# Patient Record
Sex: Female | Born: 1980
Health system: Southern US, Community
[De-identification: ages and names within clinical notes are randomized; demographics above are authoritative.]

## PROBLEM LIST (undated history)

## (undated) DIAGNOSIS — F419 Anxiety disorder, unspecified: Secondary | ICD-10-CM

## (undated) DIAGNOSIS — F329 Major depressive disorder, single episode, unspecified: Secondary | ICD-10-CM

## (undated) DIAGNOSIS — F32A Depression, unspecified: Secondary | ICD-10-CM

## (undated) DIAGNOSIS — M549 Dorsalgia, unspecified: Secondary | ICD-10-CM

## (undated) DIAGNOSIS — G4730 Sleep apnea, unspecified: Secondary | ICD-10-CM

## (undated) DIAGNOSIS — M1990 Unspecified osteoarthritis, unspecified site: Secondary | ICD-10-CM

## (undated) DIAGNOSIS — Z8719 Personal history of other diseases of the digestive system: Secondary | ICD-10-CM

## (undated) HISTORY — DX: Personal history of other diseases of the digestive system: Z87.19

## (undated) HISTORY — PX: APPENDECTOMY: SHX54

## (undated) HISTORY — DX: Unspecified osteoarthritis, unspecified site: M19.90

## (undated) HISTORY — DX: Anxiety disorder, unspecified: F41.9

## (undated) HISTORY — PX: CHOLECYSTECTOMY: SHX55

## (undated) HISTORY — DX: Depression, unspecified: F32.A

## (undated) HISTORY — DX: Sleep apnea, unspecified: G47.30

## (undated) HISTORY — DX: Major depressive disorder, single episode, unspecified: F32.9

---

## 2001-07-21 ENCOUNTER — Other Ambulatory Visit: Admission: RE | Admit: 2001-07-21 | Discharge: 2001-07-21 | Payer: Self-pay | Admitting: Obstetrics and Gynecology

## 2002-09-12 ENCOUNTER — Other Ambulatory Visit: Admission: RE | Admit: 2002-09-12 | Discharge: 2002-09-12 | Payer: Self-pay | Admitting: Obstetrics and Gynecology

## 2003-08-02 ENCOUNTER — Inpatient Hospital Stay (HOSPITAL_COMMUNITY): Admission: EM | Admit: 2003-08-02 | Discharge: 2003-08-04 | Payer: Self-pay | Admitting: Emergency Medicine

## 2003-08-03 ENCOUNTER — Encounter (INDEPENDENT_AMBULATORY_CARE_PROVIDER_SITE_OTHER): Payer: Self-pay | Admitting: Specialist

## 2003-08-31 ENCOUNTER — Encounter: Admission: RE | Admit: 2003-08-31 | Discharge: 2003-08-31 | Payer: Self-pay | Admitting: Surgery

## 2003-10-05 ENCOUNTER — Other Ambulatory Visit: Admission: RE | Admit: 2003-10-05 | Discharge: 2003-10-05 | Payer: Self-pay | Admitting: Obstetrics and Gynecology

## 2004-04-24 ENCOUNTER — Ambulatory Visit (HOSPITAL_COMMUNITY): Admission: RE | Admit: 2004-04-24 | Discharge: 2004-04-24 | Payer: Self-pay | Admitting: Internal Medicine

## 2004-05-15 ENCOUNTER — Ambulatory Visit (HOSPITAL_COMMUNITY): Admission: RE | Admit: 2004-05-15 | Discharge: 2004-05-15 | Payer: Self-pay | Admitting: Internal Medicine

## 2005-07-17 ENCOUNTER — Inpatient Hospital Stay (HOSPITAL_COMMUNITY): Admission: AD | Admit: 2005-07-17 | Discharge: 2005-07-18 | Payer: Self-pay | Admitting: Family Medicine

## 2005-08-18 ENCOUNTER — Other Ambulatory Visit: Admission: RE | Admit: 2005-08-18 | Discharge: 2005-08-18 | Payer: Self-pay | Admitting: Obstetrics and Gynecology

## 2008-06-21 ENCOUNTER — Inpatient Hospital Stay (HOSPITAL_COMMUNITY): Admission: AD | Admit: 2008-06-21 | Discharge: 2008-06-23 | Payer: Self-pay | Admitting: Obstetrics & Gynecology

## 2008-07-03 ENCOUNTER — Inpatient Hospital Stay (HOSPITAL_COMMUNITY): Admission: AD | Admit: 2008-07-03 | Discharge: 2008-07-08 | Payer: Self-pay | Admitting: Obstetrics

## 2008-07-05 ENCOUNTER — Encounter (INDEPENDENT_AMBULATORY_CARE_PROVIDER_SITE_OTHER): Payer: Self-pay | Admitting: Obstetrics and Gynecology

## 2008-07-09 ENCOUNTER — Encounter: Admission: RE | Admit: 2008-07-09 | Discharge: 2008-08-02 | Payer: Self-pay | Admitting: Certified Nurse Midwife

## 2009-06-17 ENCOUNTER — Emergency Department (HOSPITAL_COMMUNITY): Admission: EM | Admit: 2009-06-17 | Discharge: 2009-06-17 | Payer: Self-pay | Admitting: Family Medicine

## 2009-07-07 ENCOUNTER — Emergency Department (HOSPITAL_COMMUNITY): Admission: EM | Admit: 2009-07-07 | Discharge: 2009-07-07 | Payer: Self-pay | Admitting: Emergency Medicine

## 2009-08-21 ENCOUNTER — Emergency Department (HOSPITAL_COMMUNITY): Admission: EM | Admit: 2009-08-21 | Discharge: 2009-08-21 | Payer: Self-pay | Admitting: Family Medicine

## 2009-12-06 ENCOUNTER — Emergency Department (HOSPITAL_COMMUNITY): Admission: EM | Admit: 2009-12-06 | Discharge: 2009-12-06 | Payer: Self-pay | Admitting: Family Medicine

## 2010-05-22 ENCOUNTER — Emergency Department (HOSPITAL_COMMUNITY): Admission: EM | Admit: 2010-05-22 | Discharge: 2010-05-22 | Payer: Self-pay | Admitting: Emergency Medicine

## 2010-05-23 ENCOUNTER — Ambulatory Visit (HOSPITAL_COMMUNITY): Admission: RE | Admit: 2010-05-23 | Discharge: 2010-05-23 | Payer: Self-pay | Admitting: Internal Medicine

## 2010-07-11 ENCOUNTER — Ambulatory Visit (HOSPITAL_COMMUNITY): Admission: RE | Admit: 2010-07-11 | Discharge: 2010-07-11 | Payer: Self-pay | Admitting: Surgery

## 2010-09-24 ENCOUNTER — Emergency Department (HOSPITAL_COMMUNITY)
Admission: EM | Admit: 2010-09-24 | Discharge: 2010-09-24 | Payer: Self-pay | Source: Home / Self Care | Admitting: Family Medicine

## 2010-12-08 LAB — POCT RAPID STREP A (OFFICE): Streptococcus, Group A Screen (Direct): NEGATIVE

## 2010-12-10 LAB — SURGICAL PCR SCREEN
MRSA, PCR: NEGATIVE
Staphylococcus aureus: POSITIVE — AB

## 2010-12-10 LAB — PREGNANCY, URINE: Preg Test, Ur: NEGATIVE

## 2010-12-11 LAB — BASIC METABOLIC PANEL
BUN: 9 mg/dL (ref 6–23)
CO2: 27 mEq/L (ref 19–32)
Calcium: 9.2 mg/dL (ref 8.4–10.5)
Chloride: 104 mEq/L (ref 96–112)
Creatinine, Ser: 0.91 mg/dL (ref 0.4–1.2)
GFR calc Af Amer: >60 mL/min (ref >60)
GFR calc non Af Amer: >60 mL/min (ref >60)
Glucose, Bld: 93 mg/dL (ref 70–99)
Potassium: 4.4 mEq/L (ref 3.5–5.1)
Sodium: 136 mEq/L (ref 135–145)

## 2010-12-19 ENCOUNTER — Other Ambulatory Visit (HOSPITAL_COMMUNITY): Payer: Self-pay | Admitting: Orthopedic Surgery

## 2010-12-19 DIAGNOSIS — IMO0002 Reserved for concepts with insufficient information to code with codable children: Secondary | ICD-10-CM

## 2010-12-23 ENCOUNTER — Ambulatory Visit (HOSPITAL_COMMUNITY)
Admission: RE | Admit: 2010-12-23 | Discharge: 2010-12-23 | Disposition: A | Discharge status: Home / Self Care | Payer: 59 | Source: Ambulatory Visit | Attending: Orthopedic Surgery | Admitting: Orthopedic Surgery

## 2010-12-23 DIAGNOSIS — M51379 Other intervertebral disc degeneration, lumbosacral region without mention of lumbar back pain or lower extremity pain: Secondary | ICD-10-CM | POA: Insufficient documentation

## 2010-12-23 DIAGNOSIS — M5137 Other intervertebral disc degeneration, lumbosacral region: Secondary | ICD-10-CM | POA: Insufficient documentation

## 2010-12-23 DIAGNOSIS — IMO0002 Reserved for concepts with insufficient information to code with codable children: Secondary | ICD-10-CM

## 2011-01-13 ENCOUNTER — Ambulatory Visit: Payer: 59 | Attending: Orthopedic Surgery | Admitting: Rehabilitation

## 2011-01-13 DIAGNOSIS — R262 Difficulty in walking, not elsewhere classified: Secondary | ICD-10-CM | POA: Insufficient documentation

## 2011-01-13 DIAGNOSIS — M25559 Pain in unspecified hip: Secondary | ICD-10-CM | POA: Insufficient documentation

## 2011-01-13 DIAGNOSIS — IMO0001 Reserved for inherently not codable concepts without codable children: Secondary | ICD-10-CM | POA: Insufficient documentation

## 2011-01-13 DIAGNOSIS — M545 Low back pain, unspecified: Secondary | ICD-10-CM | POA: Insufficient documentation

## 2011-01-13 DIAGNOSIS — R293 Abnormal posture: Secondary | ICD-10-CM | POA: Insufficient documentation

## 2011-01-26 ENCOUNTER — Ambulatory Visit: Payer: 59

## 2011-01-28 ENCOUNTER — Ambulatory Visit: Payer: 59 | Attending: Orthopedic Surgery | Admitting: Physical Therapy

## 2011-01-28 DIAGNOSIS — M545 Low back pain, unspecified: Secondary | ICD-10-CM | POA: Insufficient documentation

## 2011-01-28 DIAGNOSIS — R293 Abnormal posture: Secondary | ICD-10-CM | POA: Insufficient documentation

## 2011-01-28 DIAGNOSIS — R262 Difficulty in walking, not elsewhere classified: Secondary | ICD-10-CM | POA: Insufficient documentation

## 2011-01-28 DIAGNOSIS — M25559 Pain in unspecified hip: Secondary | ICD-10-CM | POA: Insufficient documentation

## 2011-01-28 DIAGNOSIS — IMO0001 Reserved for inherently not codable concepts without codable children: Secondary | ICD-10-CM | POA: Insufficient documentation

## 2011-02-04 ENCOUNTER — Ambulatory Visit: Payer: 59 | Admitting: Rehabilitation

## 2011-02-05 ENCOUNTER — Ambulatory Visit: Payer: 59 | Admitting: Rehabilitation

## 2011-02-10 ENCOUNTER — Ambulatory Visit: Payer: 59 | Admitting: Physical Therapy

## 2011-02-10 NOTE — H&P (Signed)
Bonnie Lee, Bonnie Lee            ACCOUNT NO.:  [id number]   MEDICAL RECORD NO.:  [id number]          PATIENT TYPE:  INP   LOCATION:  9158                          FACILITY:  [facility]   PHYSICIAN:  Lenoard Aden, M.D.DATE OF BIRTH:  1981/05/17   DATE OF ADMISSION:  06/21/2008  DATE OF DISCHARGE:                              HISTORY & PHYSICAL   CHIEF COMPLAINT:  Elevated blood pressure, oligohydramnios.   For admission, she is a 30 year old white female G1, P0 at 35-1/7 weeks  of elevated blood pressure and an AFI of 4.3 for admission.  She has no  known drug allergies.   MEDICATIONS:  1. Prenatal vitamins.  2. Unasyn p.r.n.   FAMILY HISTORY:  Hypertension; lung, brain, stomach cancer, heart  disease, alcohol abuse, and diabetes.  She is a nonsmoker and  nondrinker.  She denies domestic physical violence pregnancy course  otherwise uncomplicated.   SURGICAL HISTORY:  Remarkable for appendectomy and ovarian cystectomy.   PRENATAL COURSE:  Today is complicated by third trimester onset of  elevated blood pressure with normal 24-hour urine 1 week ago and normal  PIH labs.   PHYSICAL EXAMINATION:  GENERAL:  Well-developed, well-nourished white  female.  VITAL SIGNS:  Blood pressure 142/92, trace proteinuria.  HEENT:  Normal.  LUNGS:  Clear.  NECK:  Supple for range of motion.  HEART:  Regular rhythm.  ABDOMEN:  Soft, gravid, nontender.  EXTREMITIES:  No right upper tenderness.  No CVA tenderness.  DTRs 2+,  trace pretibial edema, and no clonus.  PELVIC:  Cervix is closed 2 cm on,  vertex -2, extremities revealed no  cords.  NEUROLOGIC:  Nonfocal.  SKIN:  Intact.   NST is reactive.   IMPRESSION:  1. A 35-1/7 week intrauterine pregnancy.  2. Gestational hypertension, rule out preeclampsia.  3. Oligohydramnios.   PLAN:  IV fluid administration, serial labs, 24-hour urine, bedrest,  bathroom privileges, continuous monitoring.  Management pending outcome  of labs and  responds to inpatient bedrest hospitalization and serial  sonography.      Lenoard Aden, M.D.  Electronically Signed     RJT/MEDQ  D:  06/21/2008  T:  06/22/2008  Job:  [id number]

## 2011-02-10 NOTE — Discharge Summary (Signed)
Bonnie Lee, Bonnie Lee            ACCOUNT NO.:  [id number]   MEDICAL RECORD NO.:  [id number]          PATIENT TYPE:  INP   LOCATION:  9158                          [facility]   PHYSICIAN:  Lendon Colonel, MD   DATE OF BIRTH:  1981/01/05   DATE OF ADMISSION:  06/21/2008  DATE OF DISCHARGE:  06/23/2008                               DISCHARGE SUMMARY   CHIEF COMPLAINT:  Oligohydramnios.   HISTORY OF PRESENT ILLNESS:  This is a 30 year old G1 admitted at 35  weeks with oligohydramnios and then AFI of 5.  The patient otherwise had  no complaints.  No signs or symptoms of preeclampsia.  No elevated blood  pressures and otherwise normal fetal testing.  The patient was admitted  for serial blood pressures.  PIH labs, serial fetal monitoring and IV  fluid hydration.  The patient did well throughout her stay and that her  exhibited, elevated blood pressures, and on hospital day #3 was without  complaints of headache, vision change, change in symptoms, or  preeclampsia.  Her blood pressure were 130 over 70s, other vitals were  stable.  Her exam was within normal limits.  Labs resulted with a 24-  hour urine results of 186 mg of protein, uric acid of 5.1, creatinine,  AST, ALT all within normal limits.  A umbilical artery Dopplers were  within normal limits at the 60 percentile.  A repeat AFI after gentle  fluid hydration was 15 and a NST showed fetal heart tracing in the 130s  with good accelerations, no decelerations.  Tocometer showed the absence  of any contractions.  Decision was made to discharge the patient home on  modified bed rest and return to the office in 4 days for a repeat  evaluation.   DISCHARGE DIAGNOSIS:  Oligohydramnios, resolved.   DISCHARGE CONDITION:  Stable.   DISCHARGE MEDICATIONS:  Prenatal vitamins.   DISCHARGE INSTRUCTIONS:  Modified bed rest at home and returned to the  office in 4 days time for a repeat evaluation.  The patient is to call  with any  headache, vision change, or decreased fetal movement.      Lendon Colonel, MD  Electronically Signed     KAF/MEDQ  D:  06/23/2008  T:  06/24/2008  Job:  [id number]

## 2011-02-10 NOTE — Op Note (Signed)
[name], Bonnie Lee            ACCOUNT NO.:  [id number]   MEDICAL RECORD NO.:  [id number]          PATIENT TYPE:  INP   LOCATION:  9128                          [facility]   PHYSICIAN:  Lenoard Aden, M.D.DATE OF BIRTH:  [birth year]   DATE OF PROCEDURE:  DATE OF DISCHARGE:                               OPERATIVE REPORT   PREOPERATIVE DIAGNOSES:  Failure to descend, term intrauterine  pregnancy, and gestational hypertension.   POSTOPERATIVE DIAGNOSES:  Failure to descend, term intrauterine  pregnancy, and gestational hypertension.   PROCEDURE:  Primary low-segment transverse Cesarean section.   SURGEON:  Lenoard Aden, MD.   ASSISTANT:  Marlinda Mike, C.N.M.   ANESTHESIA:  Epidural by Jean Rosenthal.   ESTIMATED BLOOD LOSS:  1000 mL.   COMPLICATIONS:  None.   DRAINS:  Foley.   COUNTS:  Correct.   The patient to recovery in good condition.   FINDINGS:  Full-term living female, Apgars 8 and 9, and posterior  placenta.  Normal tubes and normal ovaries.  Two-layer uterine closure.   DESCRIPTION OF PROCEDURE:  After being apprised of risks of anesthesia,  infection, bleeding, injury to abdominal organs and need for repair, the  labor's immediate complications including bowel and bladder injury, the  patient was brought to the operating where she was administered on a  dosing of epidural anesthetic without complications and prepped and  draped in usual sterile fashion.  A Foley catheter was previously  placed.  Dilute Marcaine solution was placed.  A Pfannenstiel skin  incision was made with scalpel and carried down to fascia, which was  nicked in the midline and opened transversely using Mayo scissors.  Rectus muscle was dissected sharply in the midline.  Peritoneum was  entered sharply.  Bladder blade placed.  Visceral peritoneum was scored  sharply off the lower uterine segment.  Kerr hysterotomy incision was  made.  Atraumatic delivery of a female fetus was noted,  Apgars 8 and 9,  pediatrician's attendance.  Cord blood was collected.  Placenta was  delivered manually, intact 3-vessel cord, uterus curetted using a dry  lap pack and closed in 2 running imbricating layers of 0-Monocryl  suture.  Small subserosal fibroid in the mid fundal body of the uterus  anteriorly was noted.  At this time, good hemostasis was then achieved.  Irrigation was accomplished.  Bladder flap was inspected and found to be  hemostatic.  Fascia  was then closed using 0-Monocryl in continuous running fashion.  Subcutaneous tissue was reapproximated using 0 plain in a continuous  running fashion.  Skin was closed using a subcuticular closure of 4-0  Vicryl on a Keith needle.  Steri-Strips were placed.  The patient  tolerated the procedure well and was transferred to recovery in good  condition.      Lenoard Aden, M.D.  Electronically Signed     RJT/MEDQ  D:  07/05/2008  T:  07/06/2008  Job:  [id number]

## 2011-02-12 ENCOUNTER — Ambulatory Visit: Payer: 59 | Admitting: Physical Therapy

## 2011-02-13 NOTE — H&P (Signed)
NAMESUHAILA, TROIANO                            ACCOUNT NO.:  [id number]   MEDICAL RECORD NO.:  [id number]                   PATIENT TYPE:  EMS   LOCATION:  ED                                   FACILITY:  [facility]   PHYSICIAN:  Currie Paris, M.D.           DATE OF BIRTH:  1980-10-09   DATE OF ADMISSION:  08/02/2003  DATE OF DISCHARGE:                                HISTORY & PHYSICAL   CHIEF COMPLAINT:  Abdominal pain.   HISTORY OF PRESENT ILLNESS:  Ms. Madilyn Fireman was seen by Dr. Marisue Brooklyn, and we  were asked to see her today because of possible appendicitis.  She has about  a 30 hour history now of abdominal pain which has migrated into the right  lower quadrant and associated with some nausea and vomiting.  She notes the  pain waxes and wanes and was worse earlier, a little bit better now.  She  feels better when she is still.  She has not been hungry.  She has not had  diarrhea.  She did have a CT earlier today.  She has never had any symptoms  prior to this.  She has normal periods, but occasionally has two periods in  a month.   MEDICATIONS:  Lexapro.   ALLERGIES:  No known drug allergies.   HABITS:  Smokes:  None.  Alcohol:  None.   FAMILY HISTORY:  Basically unremarkable.  She does have some heart disease,  but no significant problems.   REVIEW OF SYSTEMS:  HEENT:  Negative.  CHEST:  No cough or shortness of  breath.  HEART:  No history of murmurs or hypertension.  ABDOMEN:  Negative  except for HPI.  GENITOURINARY:  Negative.  EXTREMITIES:  Negative.   PHYSICAL EXAMINATION:  GENERAL:  The patient is alert, oriented, generally  healthy-appearing, but somewhat comfortable.  VITAL SIGNS:  Blood pressure 119/80, pulse 82, temperature 98.  HEENT:  Head is normocephalic.  Eyes:  Pupils equal, round, and regular.  EOMs intact.  Nonicteric.  NECK:  Supple with no masses or thyromegaly.  LUNGS:  Normal respirations, clear to auscultation.  HEART:  No murmurs, rubs, or  gallops are heard.  Regular rhythm.  She has  regular carotid, femoral, and dorsalis pedis pulses.  ABDOMEN:  Soft on the left side, but markedly tender with guarding on the  right side, particularly in the right lower quadrant with some mild rebound.  Bowel sounds are positive, but diminished.  PELVIC:  Not done, they were done by Dr. Pennie Rushing.  RECTAL:  Not done, they were done by Dr. Pennie Rushing.  EXTREMITIES:  No cyanosis or edema.  Good range of motion.   LABORATORY DATA:  CT is reviewed and she has what appears to be an enlarged  appendix consistent with appendicitis.  Question some gallstones in her  gallbladder, but no evidence of acute inflammatory changes around that, and  what  appears to be a dermoid cyst in the right ovary.   IMPRESSION:  1. Acute appendicitis.  2. Dermoid cyst.  3. Possible gallstones.   PLAN:  We will proceed to laparoscopy for hopefully a laparoscopic  appendectomy.  Dr. Pennie Rushing has seen her and will be present and hopefully be  able to get her dermoid cyst out at the same time.  Again, laparoscopically,  but we talked to the patient that either of these may need to be done open.  Risks and complications were discussed.  The patient would like to proceed  with surgery.  We will start her on some intravenous fluids and antibiotics.                                               Currie Paris, M.D.    CJS/MEDQ  D:  08/02/2003  T:  08/02/2003  Job:  [id number]

## 2011-02-13 NOTE — Op Note (Signed)
Bonnie Lee, Bonnie Lee                            ACCOUNT NO.:  [id number]   MEDICAL RECORD NO.:  [id number]                   PATIENT TYPE:  INP   LOCATION:  0105                                 FACILITY:  [facility]   PHYSICIAN:  Currie Paris, M.D.           DATE OF BIRTH:  06/15/1981   DATE OF PROCEDURE:  08/02/2003  DATE OF DISCHARGE:                                 OPERATIVE REPORT   PREOPERATIVE DIAGNOSES:  1. Acute appendicitis.  2. Right ovarian dermoid cyst.   POSTOPERATIVE DIAGNOSES:  1. Acute appendicitis.  2. Right ovarian dermoid cyst.   OPERATION:  Laparoscopic appendectomy.   SURGEON:  Currie Paris, M.D.   ANESTHESIA:  General.   CLINICAL HISTORY:  This patient is a 30 year old who presented with a two  day history of right lower quadrant pain, nausea, anorexia and a CT that  showed appendicitis but incidentally found a right ovarian dermoid cyst.  After discussion with the patient and evaluation by Dr. Pennie Rushing, we elected  to proceed to laparoscopy with plan for laparoscopic appendectomy and if  possible ovarian cystectomy.   DESCRIPTION OF PROCEDURE:  The patient was seen in the holding area and had  no further questions. She was taken to the operating room and after  satisfactory general endotracheal anesthesia had been obtained, the abdomen  and perineum were prepped and draped. The umbilical area was infiltrated  with some Xylocaine, a short incision made and the fascia opened, the  pursestring placed and the Hasson introduced. The abdomen was insufflated to  15. At this point, Dr. Pennie Rushing placed a 5 mm trocar into the right lower  quadrant and a 10/11 in the left lower under direct vision and performed an  ovarian cystectomy which is dictated separately. Once that was done, I  placed a 5 mm trocar in the right upper quadrant and the patient was placed  and was already in some reverse Trendelenburg. The appendix was identified,  thickened with  some edema of the mesentery. Once it was grasped, I divided  the mesoappendix with the harmonic scalpel down to the base of the appendix.  Once that was done, the endoGIA was placed and the appendix divided. The  base appeared intact and dry and the appendix was brought out the umbilical  port. We spent several minutes with further irrigation to make sure we had  all the fluid out from the ovarian cystectomy. Once everything appeared  okay, the trocars were removed sequentially with the abdomen being  deflated through the umbilical port. This pursestring here was tied down.  The skin was closed with 4-0 Monocryl subcuticular and Dermabond. The  patient tolerated the procedure well. There were no operative complications  and all counts were correct.  Currie Paris, M.D.    CJS/MEDQ  D:  08/02/2003  T:  08/02/2003  Job:  [id number]   cc:   Lovenia Kim, D.O.  [address]  [place]  [place] [address]  Fax: [phone]   Hal Morales, M.D.  [address], [address]  [place]  [place] [address]  Fax: [phone]

## 2011-02-13 NOTE — Discharge Summary (Signed)
NAMEAMERIA, Bonnie Lee            ACCOUNT NO.:  [id number]   MEDICAL RECORD NO.:  [id number]          PATIENT TYPE:  INP   LOCATION:  9128                          [facility]   PHYSICIAN:  Lenoard Aden, M.D.DATE OF BIRTH:  [birth year]   DATE OF ADMISSION:  07/03/2008  DATE OF DISCHARGE:  07/08/2008                               DISCHARGE SUMMARY   After a prolonged cervical ripening, the patient underwent uncomplicated  primary C-section.  Her active phase arrest on July 05, 2008.  Her  postoperative course was complicated by well-controlled blood pressure.  She was discharged to home on postoperative day #3.   Discharge teaching done.   DISCHARGE MEDICATIONS:  1. Tylox.  2. Prenatal vitamins.  3. Iron.   FOLLOWUP:  Follow up in the office as needed.      Lenoard Aden, M.D.  Electronically Signed     RJT/MEDQ  D:  08/21/2008  T:  08/21/2008  Job:  [id number]

## 2011-02-13 NOTE — Op Note (Signed)
Bonnie Lee, MILBRATH                            ACCOUNT NO.:  [id number]   MEDICAL RECORD NO.:  [id number]                   PATIENT TYPE:  INP   LOCATION:  0440                                 FACILITY:  [facility]   PHYSICIAN:  Hal Morales, M.D.             DATE OF BIRTH:  Jun 05, 1981   DATE OF PROCEDURE:  08/02/2003  DATE OF DISCHARGE:                                 OPERATIVE REPORT   PREOPERATIVE DIAGNOSES:  Appendicitis and right ovarian dermoid cyst.   POSTOPERATIVE DIAGNOSES:  Appendicitis and right ovarian dermoid cyst.   OPERATION:  Laparoscopic right ovarian dermoid cystectomy, laparoscopic  appendectomy.   SURGEON:  1. Hal Morales, M.D. for the ovarian cystectomy.  2. Currie Paris, M.D. for the appendectomy.   ANESTHESIA:  General oral tracheal.   ESTIMATED BLOOD LOSS:  Less than 50 mL.   COMPLICATIONS:  None.   FINDINGS:  Upon entry into the peritoneal cavity, the uterus was noted to be  normal size. The fallopian tubes bilaterally were normal with normal fine  fimbriated end. The left ovary was normal size. The right ovary was enlarged  by a 3 cm dermoid cyst. There were no adhesions or stigmata of  endometriosis.   DESCRIPTION OF PROCEDURE:  The patient was taken to the operating room after  appropriate identification and placed on the operating table. After the  attainment of adequate general anesthesia, the patient was placed in the  modified lithotomy position. The abdomen, perineum and vagina were prepped  with multiple layers of Betadine. A Foley catheter was inserted into the  bladder under sterile conditions and a sponge stick containing three raytec  sponges was placed in the vagina for uterine manipulation. The abdomen was  then draped as a sterile field. A subumbilical incision area was infiltrated  with 0.25% Marcaine as were two areas in the suprapubic region. The  subumbilical incision was made by Dr. Jamey Ripa and taken down to the  fascia.  The fascia was incised and the peritoneum entered. The Hasson cannula was  placed in the peritoneal cavity after placement of a pursestring suture and  the pneumoperitoneum created. The laparoscope was then placed through the  trocar sleeve and the above noted findings made. Suprapubic incisions were  made to the right and left of midline and laparoscopic probe trocars placed  through those incisions into the peritoneal cavity under direct  visualization. The right ovary was elevated by the uteroovarian ligament and  the ovarian cortex inside with harmonic scalpel scissors. An attempt was  made to hydrodissect the ovarian cortex off the cyst wall; however, during  that attempt, the cyst wall was ruptured and the remainder of the ovarian  cyst wall peeled off the overlying cortex with a combination of sharp and  blunt dissection and hydrodissection. The ovarian cyst wall was removed in  pieces through the lower port in the left suprapubic region.  The remainder  of the cyst was likewise removed through that port. Copious irrigation of  approximately 4 liters of warm lactated Ringer's was undertaken and  hemostasis in the ovarian cortex on the right side noted to be  adequate. At this time, the procedure was started by Dr. Jamey Ripa to achieve  the appendectomy. The remainder of that procedure will be dictated under a  separate operative report. Specimen to pathology from this portion of the  surgical procedure is right ovarian cyst wall and dermoid.                                               Hal Morales, M.D.    VPH/MEDQ  D:  08/03/2003  T:  08/03/2003  Job:  [id number]

## 2011-02-18 ENCOUNTER — Ambulatory Visit: Payer: 59 | Admitting: Rehabilitation

## 2011-02-19 ENCOUNTER — Ambulatory Visit: Payer: 59 | Admitting: Rehabilitation

## 2011-02-24 ENCOUNTER — Ambulatory Visit: Payer: 59 | Admitting: Rehabilitation

## 2011-02-26 ENCOUNTER — Ambulatory Visit: Payer: 59 | Admitting: Rehabilitation

## 2011-03-04 ENCOUNTER — Ambulatory Visit: Payer: 59 | Attending: Rehabilitation | Admitting: Rehabilitation

## 2011-03-04 DIAGNOSIS — R262 Difficulty in walking, not elsewhere classified: Secondary | ICD-10-CM | POA: Insufficient documentation

## 2011-03-04 DIAGNOSIS — M545 Low back pain, unspecified: Secondary | ICD-10-CM | POA: Insufficient documentation

## 2011-03-04 DIAGNOSIS — R293 Abnormal posture: Secondary | ICD-10-CM | POA: Insufficient documentation

## 2011-03-04 DIAGNOSIS — IMO0001 Reserved for inherently not codable concepts without codable children: Secondary | ICD-10-CM | POA: Insufficient documentation

## 2011-03-04 DIAGNOSIS — M25559 Pain in unspecified hip: Secondary | ICD-10-CM | POA: Insufficient documentation

## 2011-03-05 ENCOUNTER — Encounter: Payer: 59 | Admitting: Rehabilitation

## 2011-03-10 ENCOUNTER — Ambulatory Visit: Payer: 59 | Admitting: Physical Therapy

## 2011-03-12 ENCOUNTER — Ambulatory Visit: Payer: 59 | Admitting: Physical Therapy

## 2011-03-17 ENCOUNTER — Ambulatory Visit: Payer: 59 | Admitting: Physical Therapy

## 2011-03-24 ENCOUNTER — Encounter: Payer: 59 | Admitting: Physical Therapy

## 2011-05-19 ENCOUNTER — Inpatient Hospital Stay (INDEPENDENT_AMBULATORY_CARE_PROVIDER_SITE_OTHER)
Admission: RE | Admit: 2011-05-19 | Discharge: 2011-05-19 | Disposition: A | Discharge status: Home / Self Care | Payer: 59 | Source: Ambulatory Visit | Attending: Emergency Medicine | Admitting: Emergency Medicine

## 2011-05-19 DIAGNOSIS — J040 Acute laryngitis: Secondary | ICD-10-CM

## 2011-05-19 LAB — POCT RAPID STREP A: Streptococcus, Group A Screen (Direct): NEGATIVE

## 2011-06-29 LAB — COMPREHENSIVE METABOLIC PANEL
ALT: 19
ALT: 21
AST: 25
AST: 24
Albumin: 2.7 — ABNORMAL LOW
Albumin: 2.6 — ABNORMAL LOW
Albumin: 2.7 — ABNORMAL LOW
Alkaline Phosphatase: 93
Alkaline Phosphatase: 102
Alkaline Phosphatase: 99
BUN: 9
BUN: 5 — ABNORMAL LOW
BUN: 7
CO2: 23
CO2: 23
CO2: 23
Calcium: 10.3
Calcium: 9.7
Chloride: 106
Chloride: 104
Chloride: 106
Creatinine, Ser: 0.61
Creatinine, Ser: 0.74
Creatinine, Ser: 0.66
GFR calc Af Amer: >60
GFR calc Af Amer: >60
GFR calc Af Amer: >60
GFR calc non Af Amer: >60
GFR calc non Af Amer: >60
GFR calc non Af Amer: >60
Glucose, Bld: 89
Glucose, Bld: 97
Glucose, Bld: 104 — ABNORMAL HIGH
Potassium: 3.6
Potassium: 3.4 — ABNORMAL LOW
Potassium: 3.8
Sodium: 137
Sodium: 139
Total Bilirubin: 0.4
Total Bilirubin: 0.2 — ABNORMAL LOW
Total Protein: 5.3 — ABNORMAL LOW
Total Protein: 6.1
Total Protein: 6.2

## 2011-06-29 LAB — CBC
HCT: 33.7 — ABNORMAL LOW
HCT: 28.4 — ABNORMAL LOW
HCT: 34.8 — ABNORMAL LOW
HCT: 34.9 — ABNORMAL LOW
Hemoglobin: 11.4 — ABNORMAL LOW
Hemoglobin: 11.8 — ABNORMAL LOW
Hemoglobin: 12.2
Hemoglobin: 9.9 — ABNORMAL LOW
MCHC: 33.9
MCHC: 34.1
MCHC: 33.9
MCV: 91.3
MCV: 91.6
MCV: 92.3
Platelets: 234
Platelets: 237
Platelets: 241
RBC: 3.67 — ABNORMAL LOW
RBC: 3.08 — ABNORMAL LOW
RBC: 3.78 — ABNORMAL LOW
RBC: 3.91
RDW: 13.3
RDW: 13.6
RDW: 13.7
WBC: 13.4 — ABNORMAL HIGH
WBC: 13.1 — ABNORMAL HIGH
WBC: 14.6 — ABNORMAL HIGH
WBC: 16.2 — ABNORMAL HIGH

## 2011-06-29 LAB — LACTATE DEHYDROGENASE
LDH: 110
LDH: 90 — ABNORMAL LOW
LDH: 106

## 2011-06-29 LAB — CREATININE CLEARANCE, URINE, 24 HOUR
Creatinine Clearance: 138 — ABNORMAL HIGH
Creatinine, 24H Ur: 1469
Creatinine: 0.74

## 2011-06-29 LAB — URIC ACID
Uric Acid, Serum: 5.5
Uric Acid, Serum: 5.3
Uric Acid, Serum: 5.8

## 2011-06-29 LAB — RH IMMUNE GLOB WKUP(>/=20WKS)(NOT WOMEN'S HOSP): Fetal Screen: NEGATIVE

## 2011-06-29 LAB — PROTEIN, URINE, 24 HOUR
Collection Interval-UPROT: 24
Protein, Urine: 6

## 2011-06-29 LAB — RPR: RPR Ser Ql: NONREACTIVE

## 2011-06-29 LAB — PLATELET COUNT: Platelets: 212

## 2011-08-03 ENCOUNTER — Encounter: Payer: Self-pay | Admitting: *Deleted

## 2011-08-03 ENCOUNTER — Emergency Department (HOSPITAL_COMMUNITY)
Admission: EM | Admit: 2011-08-03 | Discharge: 2011-08-03 | Disposition: A | Discharge status: Home / Self Care | Payer: 59 | Source: Home / Self Care

## 2011-08-03 DIAGNOSIS — K120 Recurrent oral aphthae: Secondary | ICD-10-CM

## 2011-08-03 MED ORDER — MAGIC MOUTHWASH W/LIDOCAINE: ORAL | Status: DC

## 2011-08-03 NOTE — ED Provider Notes (Signed)
History     CSN: [id number] Arrival date & time: 08/03/2011  6:29 PM   None     Chief Complaint  Patient presents with  . Sore Throat    (Consider location/radiation/quality/duration/timing/severity/associated sxs/prior treatment) HPI Comments: Pt states that her throat is sore but she also has sores on the roof of her mouth and inside of her cheeks. Has mild nasal congestion. No post nasal drainage, cough or fever. Hx of strep throat in the past.   Patient is a 30 y.o. female presenting with pharyngitis. The history is provided by the patient.  Sore Throat This is a new problem. The current episode started 2 days ago. The problem occurs constantly. The problem has not changed since onset.Pertinent negatives include no chest pain, no headaches and no shortness of breath. The symptoms are aggravated by swallowing, eating and drinking. The symptoms are relieved by nothing.    Past Medical History  Diagnosis Date  . Migraine     Past Surgical History  Procedure Date  . Abdominal surgery   . Cholecystectomy     Family History  Problem Relation Age of Onset  . Hypertension Mother     History  Substance Use Topics  . Smoking status: Never Smoker   . Smokeless tobacco: Not on file  . Alcohol Use: No    OB History    Grav Para Term Preterm Abortions TAB SAB Ect Mult Living                  Review of Systems  Constitutional: Negative for fever, chills and fatigue.  HENT: Positive for congestion, sore throat and mouth sores. Negative for ear pain, rhinorrhea, sneezing, postnasal drip and sinus pressure.   Respiratory: Negative for cough, shortness of breath and wheezing.   Cardiovascular: Negative for chest pain and palpitations.  Neurological: Negative for headaches.    Allergies  Oxycodone  Home Medications   Current Outpatient Rx  Name Route Sig Dispense Refill  . CELEXA PO Oral Take by mouth.      . MELOXICAM PO Oral Take by mouth.      Marland Kitchen PROPRANOLOL HCL  INTENSOL PO Oral Take by mouth.      . IMITREX PO Oral Take by mouth.      . TRAMADOL HCL 50 MG PO TABS Oral Take 50 mg by mouth every 6 (six) hours as needed. Maximum dose= 8 tablets per day     . MAGIC MOUTHWASH W/LIDOCAINE  Swish and gargle 5 ml every 4 hrs prn mouth and throat pain 120 mL 0    BP 128/85  Pulse 67  Temp(Src) 98.3 F (36.8 C) (Oral)  Resp 16  SpO2 100%  LMP 08/02/2011  Physical Exam  Constitutional: She appears well-developed and well-nourished. No distress.  HENT:  Head: Normocephalic and atraumatic.  Right Ear: Tympanic membrane, external ear and ear canal normal.  Left Ear: Tympanic membrane, external ear and ear canal normal.  Nose: Nose normal.  Mouth/Throat: Uvula is midline and oropharynx is clear and moist. Oral lesions (scattered shallow ulcers noted buccal mucosa) present. No oropharyngeal exudate, posterior oropharyngeal edema or posterior oropharyngeal erythema.  Neck: Neck supple.  Cardiovascular: Normal rate, regular rhythm and normal heart sounds.   Pulmonary/Chest: Effort normal and breath sounds normal. No respiratory distress.  Lymphadenopathy:    She has no cervical adenopathy.  Neurological: She is alert.  Skin: Skin is warm and dry.  Psychiatric: She has a normal mood and affect.  ED Course  Procedures (including critical care time)  Labs Reviewed - No data to display No results found.   1. Aphthous stomatitis       MDM          Melody Comas, [place] 08/03/11 1927

## 2011-08-03 NOTE — ED Notes (Signed)
Pt  Reports  Symptoms  Of a  sorethroat    With  What  She  Describes s  As  Small  Sores in her  Mouth    -     She  Reports  Pain when she  Swallows     She  Is  Awake  As  Well  As  Alert and  Oriented       Appears  In no   Acute  Distress  Talking in complete  sentances

## 2012-02-23 ENCOUNTER — Other Ambulatory Visit (HOSPITAL_COMMUNITY): Payer: Self-pay | Admitting: Physician Assistant

## 2012-02-23 DIAGNOSIS — R7989 Other specified abnormal findings of blood chemistry: Secondary | ICD-10-CM

## 2012-02-24 ENCOUNTER — Ambulatory Visit (HOSPITAL_COMMUNITY)
Admission: RE | Admit: 2012-02-24 | Discharge: 2012-02-24 | Disposition: A | Discharge status: Home / Self Care | Payer: 59 | Source: Ambulatory Visit | Attending: Physician Assistant | Admitting: Physician Assistant

## 2012-02-24 DIAGNOSIS — R7989 Other specified abnormal findings of blood chemistry: Secondary | ICD-10-CM | POA: Insufficient documentation

## 2012-04-18 ENCOUNTER — Encounter: Payer: Self-pay | Admitting: Gastroenterology

## 2012-05-16 ENCOUNTER — Ambulatory Visit: Payer: 59 | Admitting: Gastroenterology

## 2012-05-27 ENCOUNTER — Ambulatory Visit (INDEPENDENT_AMBULATORY_CARE_PROVIDER_SITE_OTHER): Payer: 59 | Admitting: Gastroenterology

## 2012-05-27 ENCOUNTER — Other Ambulatory Visit (INDEPENDENT_AMBULATORY_CARE_PROVIDER_SITE_OTHER): Payer: 59

## 2012-05-27 ENCOUNTER — Encounter: Payer: Self-pay | Admitting: Gastroenterology

## 2012-05-27 VITALS — BP 106/70 | HR 84 | Ht 64.25 in | Wt 241.0 lb

## 2012-05-27 DIAGNOSIS — K921 Melena: Secondary | ICD-10-CM

## 2012-05-27 LAB — CBC WITH DIFFERENTIAL/PLATELET
Basophils Relative: 1.4 % (ref 0.0–3.0)
Eosinophils Absolute: 0.2 10*3/uL (ref 0.0–0.7)
Eosinophils Relative: 2.3 % (ref 0.0–5.0)
HCT: 37 % (ref 36.0–46.0)
Lymphs Abs: 2.6 10*3/uL (ref 0.7–4.0)
MCHC: 33.6 g/dL (ref 30.0–36.0)
MCV: 89.3 fl (ref 78.0–100.0)
Monocytes Absolute: 0.5 10*3/uL (ref 0.1–1.0)
Neutrophils Relative %: 60.2 % (ref 43.0–77.0)
Platelets: 298 10*3/uL (ref 150.0–400.0)
RBC: 4.14 Mil/uL (ref 3.87–5.11)
WBC: 8.8 10*3/uL (ref 4.5–10.5)

## 2012-05-27 MED ORDER — MOVIPREP 100 G PO SOLR: 1.0000 | ORAL | Status: DC

## 2012-05-27 NOTE — Progress Notes (Signed)
HPI: This is a   very pleasant 31 year old woman whom I am meeting for the first time today   Therapy at behavioral health for [facility] been having rectal bleeding for a while, since this past spring. Went to PCP, who felt it may have been a hemorhoid.  Cream applied, this helped some but not completely.  Usually after a difficult BM.  She will often have to push strain, can alternate with loose stools.  She has tried colace, then imodium. Never tried fiber suplement.  Not bloody diarrhea.  Has gained 50 pounds in past year or so.  She is a stress eater.  No signficant pains in abd except with signficantly difficult BMs.  She will see blood approximately one to 2 times a week  GB removed 2 years ago.  Past 4-5 months, brisk gastrocolic reflex.  Review of systems: Pertinent positive and negative review of systems were noted in the above HPI section. Complete review of systems was performed and was otherwise normal.    Past Medical History  Diagnosis Date  . Migraine   . Anxiety   . Arthritis   . Depression   . History of gallstones   . Sleep apnea     Past Surgical History  Procedure Date  . Cholecystectomy   . Appendectomy   . Cesarean section     and ovarian cyst removed    Current Outpatient Prescriptions  Medication Sig Dispense Refill  . buPROPion (WELLBUTRIN XL) 150 MG 24 hr tablet Take 150 mg by mouth daily.      . Citalopram Hydrobromide (CELEXA PO) Take by mouth.        . MELOXICAM PO Take by mouth.        Marland Kitchen PROPRANOLOL HCL INTENSOL PO Take by mouth.        . SUMAtriptan Succinate (IMITREX PO) Take by mouth.        . traMADol (ULTRAM) 50 MG tablet Take 50 mg by mouth every 6 (six) hours as needed. Maximum dose= 8 tablets per day         Allergies as of 05/27/2012 - Review Complete 05/27/2012  Allergen Reaction Noted  . Oxycodone Itching 08/03/2011    Family History  Problem Relation Age of Onset  . Hypertension Mother   . COPD Maternal Grandmother   .  Hypertension Father     History   Social History  . Marital Status: Single    Spouse Name: N/A    Number of Children: 1  . Years of Education: N/A   Occupational History  . MENTAL HEALTH    Social History Main Topics  . Smoking status: Never Smoker   . Smokeless tobacco: Never Used  . Alcohol Use: No  . Drug Use: No  . Sexually Active: Not on file   Other Topics Concern  . Not on file   Social History Narrative  . No narrative on file       Physical Exam: BP 106/70  Pulse 84  Ht 5' 4.25" (1.632 m)  Wt 241 lb (109.317 kg)  BMI 41.05 kg/m2  LMP 05/17/2012 Constitutional: generally well-appearing Psychiatric: alert and oriented x3 Eyes: extraocular movements intact Mouth: oral pharynx moist, no lesions Neck: supple no lymphadenopathy Cardiovascular: heart regular rate and rhythm Lungs: clear to auscultation bilaterally Abdomen: soft, nontender, nondistended, no obvious ascites, no peritoneal signs, normal bowel sounds Extremities: no lower extremity edema bilaterally Skin: no lesions on visible extremities Rectal exam deferred for upcoming colonoscopy  Assessment and plan: 31 y.o. female with  relatively minor rectal bleeding, recent change in bowel habits  She will try fiber supplements for her alternating bowel habits. We'll proceed with full colonoscopy to check for causes of her minor rectal bleeding. I suspect this is benign, hemorrhoid related. She will get a CBC to see if she is anemic.

## 2012-05-27 NOTE — Patient Instructions (Addendum)
You will be set up for a colonoscopy (LEC moderate sedation) for rectal bleeding. Please start taking citrucel (orange flavored) powder fiber supplement.  This may cause some bloating at first but that usually goes away. Begin with a small spoonful and work your way up to a large, heaping spoonful daily over a week. You will have labs checked today in the basement lab.  Please head down after you check out with the front desk  (cbc).

## 2012-06-14 ENCOUNTER — Encounter (HOSPITAL_COMMUNITY): Payer: Self-pay | Admitting: *Deleted

## 2012-06-14 ENCOUNTER — Emergency Department (HOSPITAL_COMMUNITY)
Admission: EM | Admit: 2012-06-14 | Discharge: 2012-06-14 | Disposition: A | Discharge status: Home / Self Care | Payer: 59 | Source: Home / Self Care

## 2012-06-14 DIAGNOSIS — M25532 Pain in left wrist: Secondary | ICD-10-CM

## 2012-06-14 HISTORY — DX: Dorsalgia, unspecified: M54.9

## 2012-06-14 MED ORDER — MELOXICAM 7.5 MG PO TABS: 7.5000 mg | ORAL_TABLET | Freq: Every day | ORAL | Status: AC

## 2012-06-14 NOTE — ED Provider Notes (Signed)
History     CSN: [id number]  Arrival date & time 06/14/12  1958   None     Chief Complaint  Patient presents with  . Wrist Pain    (Consider location/radiation/quality/duration/timing/severity/associated sxs/prior treatment) The history is provided by the patient.   Bonnie Lee is a 31 y.o. female who complains left sharp shooting wrist pain since noon today.  Mechanism of injury: none known, suspects hyperextension during sleep.  Symptoms have been worsening since that time. Has taken tramadol for symptoms with no improvement.  No change in hand function, no paresthesias.  No prior history of related problems.  Past Medical History  Diagnosis Date  . Migraine   . Anxiety   . Arthritis   . Depression   . History of gallstones   . Sleep apnea   . Back pain     Past Surgical History  Procedure Date  . Cholecystectomy   . Appendectomy   . Cesarean section     and ovarian cyst removed    Family History  Problem Relation Age of Onset  . Hypertension Mother   . COPD Maternal Grandmother   . Hypertension Father     History  Substance Use Topics  . Smoking status: Never Smoker   . Smokeless tobacco: Never Used  . Alcohol Use: No    OB History    Grav Para Term Preterm Abortions TAB SAB Ect Mult Living                  Review of Systems  Constitutional: Negative.   Respiratory: Negative.   Cardiovascular: Negative.   Musculoskeletal: Positive for arthralgias. Negative for myalgias, back pain, joint swelling and gait problem.  Neurological: Negative.     Allergies  Oxycodone  Home Medications   Current Outpatient Rx  Name Route Sig Dispense Refill  . BUPROPION HCL ER (XL) 150 MG PO TB24 Oral Take 150 mg by mouth daily.    . CELEXA PO Oral Take by mouth.      Marland Kitchen PROPRANOLOL HCL INTENSOL PO Oral Take by mouth.      . IMITREX PO Oral Take by mouth.      . TRAMADOL HCL 50 MG PO TABS Oral Take 50 mg by mouth every 6 (six) hours as needed. Maximum  dose= 8 tablets per day     . MELOXICAM 7.5 MG PO TABS Oral Take 1 tablet (7.5 mg total) by mouth daily. 30 tablet 1  . MOVIPREP 100 G PO SOLR Oral Take 1 kit (100 g total) by mouth as directed. Name brand only 1 kit 0    Dispense as written.    BP 118/70  Pulse 70  Temp 98.6 F (37 C) (Oral)  Resp 16  SpO2 99%  LMP 05/17/2012  Physical Exam  Nursing note and vitals reviewed. Constitutional: She is oriented to person, place, and time. Vital signs are normal. She appears well-developed and well-nourished. She is active and cooperative.  HENT:  Head: Normocephalic.  Eyes: Conjunctivae normal are normal. Pupils are equal, round, and reactive to light. No scleral icterus.  Neck: Trachea normal. Neck supple.  Cardiovascular: Normal rate and regular rhythm.   Pulmonary/Chest: Effort normal and breath sounds normal.  Musculoskeletal:       Right wrist: Normal.       Left wrist: She exhibits tenderness. She exhibits normal range of motion, no bony tenderness, no swelling, no effusion, no crepitus, no deformity and no laceration.  Right hand: Normal.       Left hand: Normal.       Tenderness over ulnar aspect of left wrist  Neurological: She is alert and oriented to person, place, and time. She has normal strength. No cranial nerve deficit or sensory deficit. GCS eye subscore is 4. GCS verbal subscore is 5. GCS motor subscore is 6.  Skin: Skin is warm and dry.  Psychiatric: She has a normal mood and affect. Her speech is normal and behavior is normal. Judgment and thought content normal. Cognition and memory are normal.    ED Course  Procedures (including critical care time)  Labs Reviewed - No data to display No results found.   1. Wrist pain, left       MDM  Ace to wrist, wear for comfort for 1-2 weeks, Meloxicam daily, take home Ultram for worsening pain.  Follow up with orthopedist in 2 weeks if symptoms are not improved.         Johnsie Kindred, NP 06/16/12  [phone]

## 2012-06-14 NOTE — ED Notes (Signed)
Denies injury.  Started to notice left wrist pain while at work this afternoon; had pain mostly when she would try to move it.  This evening pain became more constant, occuring even at rest.  Took a tramadol.

## 2012-06-17 ENCOUNTER — Ambulatory Visit (AMBULATORY_SURGERY_CENTER): Payer: 59 | Admitting: Gastroenterology

## 2012-06-17 ENCOUNTER — Encounter: Payer: Self-pay | Admitting: Gastroenterology

## 2012-06-17 VITALS — BP 151/54 | HR 101 | Temp 98.7°F | Resp 14 | Ht 64.0 in | Wt 241.0 lb

## 2012-06-17 DIAGNOSIS — K5730 Diverticulosis of large intestine without perforation or abscess without bleeding: Secondary | ICD-10-CM

## 2012-06-17 DIAGNOSIS — K921 Melena: Secondary | ICD-10-CM

## 2012-06-17 DIAGNOSIS — K644 Residual hemorrhoidal skin tags: Secondary | ICD-10-CM

## 2012-06-17 MED ORDER — SODIUM CHLORIDE 0.9 % IV SOLN: 500.0000 mL | INTRAVENOUS | Status: DC

## 2012-06-17 NOTE — Progress Notes (Addendum)
Patient did not have preoperative order for IV antibiotic SSI prophylaxis. (G8918)   

## 2012-06-17 NOTE — Patient Instructions (Addendum)
YOU HAD AN ENDOSCOPIC PROCEDURE TODAY AT THE [facility]: Refer to the procedure report that was given to you for any specific questions about what was found during the examination.  If the procedure report does not answer your questions, please call your gastroenterologist to clarify.  If you requested that your care partner not be given the details of your procedure findings, then the procedure report has been included in a sealed envelope for you to review at your convenience later.  YOU SHOULD EXPECT: Some feelings of bloating in the abdomen. Passage of more gas than usual.  Walking can help get rid of the air that was put into your GI tract during the procedure and reduce the bloating. If you had a lower endoscopy (such as a colonoscopy or flexible sigmoidoscopy) you may notice spotting of blood in your stool or on the toilet paper. If you underwent a bowel prep for your procedure, then you may not have a normal bowel movement for a few days.  DIET: Your first meal following the procedure should be a light meal and then it is ok to progress to your normal diet.  A half-sandwich or bowl of soup is an example of a good first meal.  Heavy or fried foods are harder to digest and may make you feel nauseous or bloated.  Likewise meals heavy in dairy and vegetables can cause extra gas to form and this can also increase the bloating.  Drink plenty of fluids but you should avoid alcoholic beverages for 24 hours.  ACTIVITY: Your care partner should take you home directly after the procedure.  You should plan to take it easy, moving slowly for the rest of the day.  You can resume normal activity the day after the procedure however you should NOT DRIVE or use heavy machinery for 24 hours (because of the sedation medicines used during the test).    SYMPTOMS TO REPORT IMMEDIATELY: A gastroenterologist can be reached at any hour.  During normal business hours, 8:30 AM to 5:00 PM Monday through Friday,  call [phone].  After hours and on weekends, please call the GI answering service at [phone] who will take a message and have the physician on call contact you.   Following lower endoscopy (colonoscopy or flexible sigmoidoscopy):  Excessive amounts of blood in the stool  Significant tenderness or worsening of abdominal pains  Swelling of the abdomen that is new, acute  Fever of 100F or higher  FOLLOW UP: If any biopsies were taken you will be contacted by phone or by letter within the next 1-3 weeks.  Call your gastroenterologist if you have not heard about the biopsies in 3 weeks.  Our staff will call the home number listed on your records the next business day following your procedure to check on you and address any questions or concerns that you may have at that time regarding the information given to you following your procedure. This is a courtesy call and so if there is no answer at the home number and we have not heard from you through the emergency physician on call, we will assume that you have returned to your regular daily activities without incident.  SIGNATURES/CONFIDENTIALITY: You and/or your care partner have signed paperwork which will be entered into your electronic medical record.  These signatures attest to the fact that that the information above on your After Visit Summary has been reviewed and is understood.  Full responsibility of the confidentiality of this   discharge information lies with you and/or your care-partner.   Thank-you for choosing us for your medical needs. 

## 2012-06-17 NOTE — Op Note (Signed)
[facility] [address] [place], [address]   COLONOSCOPY PROCEDURE REPORT  PATIENT: Bonnie, Lee  MR#: [id number] BIRTHDATE: Aug 07, 1981 , 31  yrs. old GENDER: Female ENDOSCOPIST: Rachael Fee, MD REFERRED NF:AOZHYQ COLLIER, P.A. PROCEDURE DATE:  06/17/2012 PROCEDURE:   Colonoscopy, diagnostic ASA CLASS:   Class II INDICATIONS:constipation, minor rectal bleeding. MEDICATIONS: Fentanyl 100 mcg IV, Versed 10 mg IV, and These medications were titrated to patient response per physician's verbal order  DESCRIPTION OF PROCEDURE:   After the risks benefits and alternatives of the procedure were thoroughly explained, informed consent was obtained.  A digital rectal exam revealed no abnormalities of the rectum.   The LB [id number]  endoscope was introduced through the anus and advanced to the cecum, which was identified by both the appendix and ileocecal valve. No adverse events experienced.   The quality of the prep was good, using MoviPrep  The instrument was then slowly withdrawn as the colon was fully examined.   COLON FINDINGS: Mild diverticulosis was noted in the left colon. Small external hemorrhoids were found.   The colon mucosa was otherwise normal.  Retroflexed views revealed no abnormalities. The time to cecum=5 minutes 20 seconds.  Withdrawal time=5 minutes 12 seconds.  The scope was withdrawn and the procedure completed. COMPLICATIONS: There were no complications.  ENDOSCOPIC IMPRESSION: 1.   Mild diverticulosis was noted in the left colon 2.   Small external hemorrhoids 3.   The colon mucosa was otherwise normal; no polyps or cancers  RECOMMENDATIONS: Continue taking daily fiber supplement since it seems to be helping.   eSigned:  Rachael Fee, MD 06/17/2012 3:56 PM

## 2012-06-20 ENCOUNTER — Telehealth: Payer: Self-pay

## 2012-06-20 NOTE — Telephone Encounter (Signed)
  Follow up Call-  Call back number 06/17/2012  Post procedure Call Back phone  # [phone]  Permission to leave phone message Yes     Patient questions:  Do you have a fever, pain , or abdominal swelling? no Pain Score  0 *  Have you tolerated food without any problems? yes  Have you been able to return to your normal activities? yes  Do you have any questions about your discharge instructions: Diet   no Medications  no Follow up visit  no  Do you have questions or concerns about your Care? no  Actions: * If pain score is 4 or above: No action needed, pain <4.

## 2012-06-24 NOTE — ED Provider Notes (Signed)
Medical screening examination/treatment/procedure(s) were performed by resident physician or non-physician practitioner and as supervising physician I was immediately available for consultation/collaboration.   Reia Viernes DOUGLAS MD.    Harrel Ferrone D Winfrey Chillemi, MD 06/24/12 0826 

## 2013-01-19 ENCOUNTER — Telehealth: Payer: Self-pay | Admitting: Gastroenterology

## 2013-01-19 NOTE — Telephone Encounter (Signed)
Per Enid Baas this patient has transferred care to them and has an appt on 01-20-13. She was transferred to medial records to request last OV and lab work.

## 2013-01-31 ENCOUNTER — Other Ambulatory Visit (HOSPITAL_COMMUNITY)
Admission: RE | Admit: 2013-01-31 | Discharge: 2013-01-31 | Disposition: A | Discharge status: Home / Self Care | Payer: BC Managed Care – PPO | Source: Ambulatory Visit | Attending: Obstetrics and Gynecology | Admitting: Obstetrics and Gynecology

## 2013-01-31 ENCOUNTER — Other Ambulatory Visit: Payer: Self-pay | Admitting: Obstetrics and Gynecology

## 2013-01-31 DIAGNOSIS — Z1151 Encounter for screening for human papillomavirus (HPV): Secondary | ICD-10-CM | POA: Insufficient documentation

## 2013-01-31 DIAGNOSIS — Z01419 Encounter for gynecological examination (general) (routine) without abnormal findings: Secondary | ICD-10-CM | POA: Insufficient documentation

## 2013-11-12 ENCOUNTER — Encounter (HOSPITAL_COMMUNITY): Payer: Self-pay | Admitting: Emergency Medicine

## 2013-11-12 ENCOUNTER — Emergency Department (HOSPITAL_COMMUNITY)
Admission: EM | Admit: 2013-11-12 | Discharge: 2013-11-12 | Disposition: A | Discharge status: Home / Self Care | Payer: BC Managed Care – PPO | Source: Home / Self Care

## 2013-11-12 DIAGNOSIS — M79644 Pain in right finger(s): Secondary | ICD-10-CM

## 2013-11-12 DIAGNOSIS — M79609 Pain in unspecified limb: Secondary | ICD-10-CM

## 2013-11-12 NOTE — ED Provider Notes (Signed)
CSN: [id number]     Arrival date & time 11/12/13  1816 History   None    Chief Complaint  Patient presents with  . Hand Pain     (Consider location/radiation/quality/duration/timing/severity/associated sxs/prior Treatment) HPI Comments: Patient reports 35min for right 5th finger pain. No injury. She noticed that it appeared swollen in the DIP joint. No redness or warmth. No fever or chills. Full ROM. No additional hand pain is noted. No numbness or tingling.   Patient is a 33 y.o. female presenting with hand pain. The history is provided by the patient.  Hand Pain    Past Medical History  Diagnosis Date  . Migraine   . Anxiety   . Arthritis   . Depression   . History of gallstones   . Sleep apnea   . Back pain    Past Surgical History  Procedure Laterality Date  . Cholecystectomy    . Appendectomy    . Cesarean section      and ovarian cyst removed   Family History  Problem Relation Age of Onset  . Hypertension Mother   . COPD Maternal Grandmother   . Hypertension Father    History  Substance Use Topics  . Smoking status: Never Smoker   . Smokeless tobacco: Never Used  . Alcohol Use: No   OB History   Grav Para Term Preterm Abortions TAB SAB Ect Mult Living                 Review of Systems  All other systems reviewed and are negative.      Allergies  Oxycodone  Home Medications   Current Outpatient Rx  Name  Route  Sig  Dispense  Refill  . buPROPion (WELLBUTRIN XL) 150 MG 24 hr tablet   Oral   Take 150 mg by mouth daily.         . Citalopram Hydrobromide (CELEXA PO)   Oral   Take by mouth.           . cyclobenzaprine (FLEXERIL) 5 MG tablet   Oral   Take 5 mg by mouth 3 (three) times daily as needed.         . meloxicam (MOBIC) 7.5 MG tablet   Oral   Take 1 tablet (7.5 mg total) by mouth daily.   30 tablet   1   . PROPRANOLOL HCL INTENSOL PO   Oral   Take by mouth.           . SUMAtriptan Succinate (IMITREX PO)   Oral  Take by mouth.           . traMADol (ULTRAM) 50 MG tablet   Oral   Take 50 mg by mouth every 6 (six) hours as needed. Maximum dose= 8 tablets per day           BP 118/84  Pulse 91  Temp(Src) 98.6 F (37 C) (Oral)  Resp 18  SpO2 97% Physical Exam  Nursing note and vitals reviewed. Constitutional: She is oriented to person, place, and time. She appears well-developed and well-nourished. No distress.  HENT:  Head: Normocephalic and atraumatic.  Musculoskeletal: Normal range of motion. She exhibits no edema and no tenderness.  Right 5th finger, tender to palpation along the DIP joint. No erythema, warmth, or synovitis is noted. Full ROM in all adjacent joints.   Neurological: She is alert and oriented to person, place, and time.  Skin: Skin is warm and dry. No rash noted. She is  not diaphoretic. No erythema.  Psychiatric: Her behavior is normal.    ED Course  Procedures (including critical care time) Labs Review Labs Reviewed - No data to display Imaging Review No results found.    MDM   Final diagnoses:  Finger pain, right    No urgent issues today is noted in the finger or hand. Reassurance given.     Riki Sheer, PA-C 11/12/13 [phone]

## 2013-11-12 NOTE — ED Provider Notes (Signed)
Medical screening examination/treatment/procedure(s) were performed by resident physician or non-physician practitioner and as supervising physician I was immediately available for consultation/collaboration.   Barkley BrunsKINDL,Aileena Iglesia DOUGLAS MD.   Linna HoffJames D Guy Seese, MD 11/12/13 [phone]

## 2013-11-12 NOTE — ED Notes (Signed)
Pt  Reports  Pain  r  Pinky  X   15  mins    denys  Any  Injury           Cap  Refill is  wnl      No  Obvious  Deformity

## 2013-11-12 NOTE — Discharge Instructions (Signed)
Etiology of your finger pain is unclear. However no evidence of infection, swelling or inflammation. It has full ROM and therefore would just watch for now. Please f/u with your PCP if things worsen.

## 2017-04-05 DIAGNOSIS — F320 Major depressive disorder, single episode, mild: Secondary | ICD-10-CM | POA: Diagnosis not present

## 2017-04-28 DIAGNOSIS — F320 Major depressive disorder, single episode, mild: Secondary | ICD-10-CM | POA: Diagnosis not present

## 2018-01-27 DIAGNOSIS — Z114 Encounter for screening for human immunodeficiency virus [HIV]: Secondary | ICD-10-CM | POA: Diagnosis not present

## 2018-01-27 DIAGNOSIS — Z1159 Encounter for screening for other viral diseases: Secondary | ICD-10-CM | POA: Diagnosis not present

## 2018-01-27 DIAGNOSIS — Z113 Encounter for screening for infections with a predominantly sexual mode of transmission: Secondary | ICD-10-CM | POA: Diagnosis not present

## 2018-01-27 DIAGNOSIS — Z01419 Encounter for gynecological examination (general) (routine) without abnormal findings: Secondary | ICD-10-CM | POA: Diagnosis not present

## 2018-01-27 DIAGNOSIS — Z6834 Body mass index (BMI) 34.0-34.9, adult: Secondary | ICD-10-CM | POA: Diagnosis not present

## 2018-01-27 DIAGNOSIS — Z118 Encounter for screening for other infectious and parasitic diseases: Secondary | ICD-10-CM | POA: Diagnosis not present

## 2018-01-27 MED FILL — metroNIDAZOLE 500 MG TABS: 500 | 7 days supply | Qty: 14 | Fill #0

## 2018-02-22 DIAGNOSIS — F320 Major depressive disorder, single episode, mild: Secondary | ICD-10-CM | POA: Diagnosis not present

## 2018-03-04 MED FILL — SERTRALINE HCL 100 MG TAB: 100 | 30 days supply | Qty: 30 | Fill #0

## 2018-03-04 MED FILL — BUPROPION HCL XL 300 MG TAB: 300 | 30 days supply | Qty: 30 | Fill #0

## 2018-03-23 DIAGNOSIS — F320 Major depressive disorder, single episode, mild: Secondary | ICD-10-CM | POA: Diagnosis not present

## 2018-04-26 DIAGNOSIS — F320 Major depressive disorder, single episode, mild: Secondary | ICD-10-CM | POA: Diagnosis not present

## 2018-06-02 DIAGNOSIS — H5213 Myopia, bilateral: Secondary | ICD-10-CM | POA: Diagnosis not present

## 2018-06-07 DIAGNOSIS — F320 Major depressive disorder, single episode, mild: Secondary | ICD-10-CM | POA: Diagnosis not present

## 2018-06-17 MED FILL — SERTRALINE HCL 100 MG TAB: 100 | 30 days supply | Qty: 30 | Fill #1

## 2018-06-17 MED FILL — BuPROPion HCL ER (XL) 300 M: 300 | 30 days supply | Qty: 30 | Fill #1

## 2018-07-20 DIAGNOSIS — F320 Major depressive disorder, single episode, mild: Secondary | ICD-10-CM | POA: Diagnosis not present

## 2018-08-16 MED FILL — SERTRALINE HCL 100 MG TAB: 100 | 30 days supply | Qty: 30 | Fill #2

## 2018-09-01 DIAGNOSIS — F4323 Adjustment disorder with mixed anxiety and depressed mood: Secondary | ICD-10-CM | POA: Diagnosis not present

## 2018-09-12 DIAGNOSIS — F4323 Adjustment disorder with mixed anxiety and depressed mood: Secondary | ICD-10-CM | POA: Diagnosis not present

## 2018-09-26 DIAGNOSIS — F4323 Adjustment disorder with mixed anxiety and depressed mood: Secondary | ICD-10-CM | POA: Diagnosis not present

## 2018-10-04 DIAGNOSIS — F4323 Adjustment disorder with mixed anxiety and depressed mood: Secondary | ICD-10-CM | POA: Diagnosis not present

## 2018-10-13 DIAGNOSIS — F4323 Adjustment disorder with mixed anxiety and depressed mood: Secondary | ICD-10-CM | POA: Diagnosis not present

## 2018-10-18 ENCOUNTER — Ambulatory Visit (INDEPENDENT_AMBULATORY_CARE_PROVIDER_SITE_OTHER): Payer: Self-pay | Admitting: Physician Assistant

## 2018-10-18 VITALS — BP 135/80 | HR 95 | Temp 98.7°F | Resp 18 | Wt 201.6 lb

## 2018-10-18 DIAGNOSIS — B9789 Other viral agents as the cause of diseases classified elsewhere: Secondary | ICD-10-CM

## 2018-10-18 DIAGNOSIS — J019 Acute sinusitis, unspecified: Secondary | ICD-10-CM

## 2018-10-18 DIAGNOSIS — J029 Acute pharyngitis, unspecified: Secondary | ICD-10-CM

## 2018-10-18 LAB — POCT RAPID STREP A (OFFICE): Rapid Strep A Screen: NEGATIVE

## 2018-10-18 MED ORDER — SALINE SPRAY 0.65 % NA SOLN: 1.0000 | NASAL | 0 refills | Status: AC | PRN

## 2018-10-18 MED ORDER — FLUTICASONE PROPIONATE 50 MCG/ACT NA SUSP: 2.0000 | Freq: Every day | NASAL | 0 refills | Status: AC

## 2018-10-18 MED ORDER — PSEUDOEPH-BROMPHEN-DM 30-2-10 MG/5ML PO SYRP: 5.0000 mL | ORAL_SOLUTION | Freq: Four times a day (QID) | ORAL | 0 refills | Status: AC | PRN

## 2018-10-18 MED FILL — FLUTICASONE PROP 50 MCG SPR: 50 | 30 days supply | Qty: 16 | Fill #0

## 2018-10-18 MED FILL — BROMPHENIR-PSEUDOEPHED-DM S: 30-2-10 | 6 days supply | Qty: 120 | Fill #0

## 2018-10-18 NOTE — Progress Notes (Signed)
MRN: [id number] DOB: November 24, 1980  Subjective:   Bonnie Lee is a 38 y.o. female presenting for chief complaint of sore throat, sinus pressure, teeth pain, and nasal congestion x 3 days. Having some post nasal drainage, which is making her cough.  Has tried tylenol cold and flu med with no full relief. Denies fever, inability to swallow, voice change, productive cough, wheezing, shortness of breath, chest pain and myalgia, night sweats, nausea, vomiting, abdominal pain and diarrhea. Has not had sick contact with anyone. Has history of seasonal allergies. Denies history of asthma, COPD, DM, or HTN.  Patient has not had flu shot this season. Denies smoking. Has IUD. Denies any other aggravating or relieving factors, no other questions or concerns.  Review of Systems  Eyes: Negative for blurred vision and double vision.  Neurological: Negative for dizziness, weakness and headaches.    Shelley has a current medication list which includes the following prescription(s): brompheniramine-pseudoephedrine-dm, bupropion, citalopram hydrobromide, cyclobenzaprine, fluticasone, meloxicam, propranolol hcl, sodium chloride, sumatriptan succinate, and tramadol. Also is allergic to oxycodone.  Junette  has a past medical history of Anxiety, Arthritis, Back pain, Depression, History of gallstones, Migraine, and Sleep apnea. Also  has a past surgical history that includes Cholecystectomy; Appendectomy; and Cesarean section.   Objective:   Vitals: BP 135/80 (BP Location: Right Arm, Patient Position: Sitting, Cuff Size: Normal)   Pulse 95   Temp 98.7 F (37.1 C) (Oral)   Resp 18   Wt 201 lb 9.6 oz (91.4 kg)   SpO2 98%   BMI 34.60 kg/m   Physical Exam Vitals signs reviewed.  Constitutional:      General: She is not in acute distress.    Appearance: She is well-developed. She is not ill-appearing or toxic-appearing.  HENT:     Head: Normocephalic and atraumatic. No right periorbital erythema or left  periorbital erythema.     Right Ear: Tympanic membrane, ear canal and external ear normal.     Left Ear: Tympanic membrane, ear canal and external ear normal.     Nose: Mucosal edema and congestion present.     Right Sinus: Maxillary sinus tenderness present. No frontal sinus tenderness.     Left Sinus: Maxillary sinus tenderness present. No frontal sinus tenderness.     Mouth/Throat:     Lips: Pink.     Mouth: Mucous membranes are moist.     Dentition: Normal dentition. No dental tenderness, gingival swelling, dental caries or dental abscesses.     Pharynx: Uvula midline. Posterior oropharyngeal erythema present.     Tonsils: No tonsillar exudate or tonsillar abscesses. Swelling: 2+ on the right. 2+ on the left.     Comments: Malodorous breath noted.  Eyes:     General: Lids are normal.     Extraocular Movements: Extraocular movements intact.     Conjunctiva/sclera: Conjunctivae normal.     Pupils: Pupils are equal, round, and reactive to light.  Neck:     Musculoskeletal: Full passive range of motion without pain and normal range of motion.     Trachea: Phonation normal.  Cardiovascular:     Rate and Rhythm: Normal rate and regular rhythm.     Heart sounds: Normal heart sounds.  Pulmonary:     Effort: Pulmonary effort is normal.     Breath sounds: Normal breath sounds. No decreased breath sounds, wheezing, rhonchi or rales.  Lymphadenopathy:     Head:     Right side of head: No submental, submandibular, tonsillar, preauricular, posterior auricular  or occipital adenopathy.     Left side of head: No submental, submandibular, tonsillar, preauricular, posterior auricular or occipital adenopathy.     Cervical: Cervical adenopathy present.     Right cervical: Superficial cervical adenopathy present.     Left cervical: Superficial cervical adenopathy present.     Upper Body:     Right upper body: No supraclavicular adenopathy.     Left upper body: No supraclavicular adenopathy.   Skin:    General: Skin is warm and dry.  Neurological:     Mental Status: She is alert.     Results for orders placed or performed in visit on 10/18/18 (from the past 24 hour(s))  POCT rapid strep A     Status: Normal   Collection Time: 10/18/18  1:00 PM  Result Value Ref Range   Rapid Strep A Screen Negative Negative    Centor Score of 2  Assessment and Plan :  1. Acute viral sinusitis Patient is overall well appearing, NAD. VSS. POC strep test negative. No acute findings on neuro exam. Tonsils are 2+ b/l, no erythema or exudates. No dental abscess noted on dental exam. Hx and sx consistent with viral sinusitis. Rec symptomatic tx at this time. Encouraged pt to contact office if no improvement in 5 days, could consider Rx fo abx at that time to cover for bacterial etiology. Advised to seek care sooner if symptoms worsen/develops new concerning symptoms w/ current tx plan. Patient voices understanding.  - brompheniramine-pseudoephedrine-DM 30-2-10 MG/5ML syrup; Take 5 mLs by mouth 4 (four) times daily as needed.  Dispense: 120 mL; Refill: 0 - fluticasone (FLONASE) 50 MCG/ACT nasal spray; Place 2 sprays into both nostrils daily.  Dispense: 16 g; Refill: 0 - sodium chloride (OCEAN) 0.65 % SOLN nasal spray; Place 1 spray into both nostrils as needed for congestion.  Dispense: 60 mL; Refill: 0  2. Sore throat - POCT rapid strep A    Benjiman CoreBrittany Alesa Echevarria, PA-C  [facility] 10/18/2018 1:44 PM

## 2018-10-18 NOTE — Patient Instructions (Addendum)
Sinusitis, Adult  This is consistent with viral sinusitis. I suggest using bromfed syrup, flonase, and nasal saline rinses daily to help with overall congestion.  Take ibuprofen as prescribed for general discomfort. If no improvement  In 5-7 days, please contact our office as you may need any antibiotic at that time.  If any symptoms worsen or you develop new concerning symptoms, seek care immediately at family doctor, urgent care, or ED.   Sinusitis is soreness and swelling (inflammation) of your sinuses. Sinuses are hollow spaces in the bones around your face. They are located:  Around your eyes.  In the middle of your forehead.  Behind your nose.  In your cheekbones. Your sinuses and nasal passages are lined with a fluid called mucus. Mucus drains out of your sinuses. Swelling can trap mucus in your sinuses. This lets germs (bacteria, virus, or fungus) grow, which leads to infection. Most of the time, this condition is caused by a virus. What are the causes? This condition is caused by:  Allergies.  Asthma.  Germs.  Things that block your nose or sinuses.  Growths in the nose (nasal polyps).  Chemicals or irritants in the air.  Fungus (rare). What increases the risk? You are more likely to develop this condition if:  You have a weak body defense system (immune system).  You do a lot of swimming or diving.  You use nasal sprays too much.  You smoke. What are the signs or symptoms? The main symptoms of this condition are pain and a feeling of pressure around the sinuses. Other symptoms include:  Stuffy nose (congestion).  Runny nose (drainage).  Swelling and warmth in the sinuses.  Headache.  Toothache.  A cough that may get worse at night.  Mucus that collects in the throat or the back of the nose (postnasal drip).  Being unable to smell and taste.  Being very tired (fatigue).  A fever.  Sore throat.  Bad breath. How is this diagnosed? This  condition is diagnosed based on:  Your symptoms.  Your medical history.  A physical exam.  Tests to find out if your condition is short-term (acute) or long-term (chronic). Your doctor may: ? Check your nose for growths (polyps). ? Check your sinuses using a tool that has a light (endoscope). ? Check for allergies or germs. ? Do imaging tests, such as an MRI or CT scan. How is this treated? Treatment for this condition depends on the cause and whether it is short-term or long-term.  If caused by a virus, your symptoms should go away on their own within 10 days. You may be given medicines to relieve symptoms. They include: ? Medicines that shrink swollen tissue in the nose. ? Medicines that treat allergies (antihistamines). ? A spray that treats swelling of the nostrils. ? Rinses that help get rid of thick mucus in your nose (nasal saline washes).  If caused by bacteria, your doctor may wait to see if you will get better without treatment. You may be given antibiotic medicine if you have: ? A very bad infection. ? A weak body defense system.  If caused by growths in the nose, you may need to have surgery. Follow these instructions at home: Medicines  Take, use, or apply over-the-counter and prescription medicines only as told by your doctor. These may include nasal sprays.  If you were prescribed an antibiotic medicine, take it as told by your doctor. Do not stop taking the antibiotic even if you start to  feel better. Hydrate and humidify   Drink enough water to keep your pee (urine) pale yellow.  Use a cool mist humidifier to keep the humidity level in your home above 50%.  Breathe in steam for 10-15 minutes, 3-4 times a day, or as told by your doctor. You can do this in the bathroom while a hot shower is running.  Try not to spend time in cool or dry air. Rest  Rest as much as you can.  Sleep with your head raised (elevated).  Make sure you get enough sleep each  night. General instructions   Put a warm, moist washcloth on your face 3-4 times a day, or as often as told by your doctor. This will help with discomfort.  Wash your hands often with soap and water. If there is no soap and water, use hand sanitizer.  Do not smoke. Avoid being around people who are smoking (secondhand smoke).  Keep all follow-up visits as told by your doctor. This is important. Contact a doctor if:  You have a fever.  Your symptoms get worse.  Your symptoms do not get better within 10 days. Get help right away if:  You have a very bad headache.  You cannot stop throwing up (vomiting).  You have very bad pain or swelling around your face or eyes.  You have trouble seeing.  You feel confused.  Your neck is stiff.  You have trouble breathing. Summary  Sinusitis is swelling of your sinuses. Sinuses are hollow spaces in the bones around your face.  This condition is caused by tissues in your nose that become inflamed or swollen. This traps germs. These can lead to infection.  If you were prescribed an antibiotic medicine, take it as told by your doctor. Do not stop taking it even if you start to feel better.  Keep all follow-up visits as told by your doctor. This is important. This information is not intended to replace advice given to you by your health care provider. Make sure you discuss any questions you have with your health care provider. Document Released: 03/02/2008 Document Revised: 02/14/2018 Document Reviewed: 02/14/2018 Elsevier Interactive Patient Education  2019 [employer].

## 2018-10-20 DIAGNOSIS — F4323 Adjustment disorder with mixed anxiety and depressed mood: Secondary | ICD-10-CM | POA: Diagnosis not present

## 2018-10-26 DIAGNOSIS — G4700 Insomnia, unspecified: Secondary | ICD-10-CM | POA: Diagnosis not present

## 2018-10-26 DIAGNOSIS — Z0000 Encounter for general adult medical examination without abnormal findings: Secondary | ICD-10-CM | POA: Diagnosis not present

## 2018-10-26 DIAGNOSIS — R946 Abnormal results of thyroid function studies: Secondary | ICD-10-CM | POA: Diagnosis not present

## 2018-10-26 DIAGNOSIS — F329 Major depressive disorder, single episode, unspecified: Secondary | ICD-10-CM | POA: Diagnosis not present

## 2018-10-26 DIAGNOSIS — R7309 Other abnormal glucose: Secondary | ICD-10-CM | POA: Diagnosis not present

## 2018-10-26 MED FILL — SERTRALINE HCL 100 MG TAB: 100 | 90 days supply | Qty: 90 | Fill #0

## 2018-10-26 MED FILL — traZODone HCL 50 MG TABS: 50 | 30 days supply | Qty: 60 | Fill #0

## 2018-10-26 MED FILL — buPROPion HCL ER (XL) 150 M: 150 | 90 days supply | Qty: 90 | Fill #0

## 2018-10-27 DIAGNOSIS — F4323 Adjustment disorder with mixed anxiety and depressed mood: Secondary | ICD-10-CM | POA: Diagnosis not present

## 2018-11-01 DIAGNOSIS — F4323 Adjustment disorder with mixed anxiety and depressed mood: Secondary | ICD-10-CM | POA: Diagnosis not present

## 2018-11-02 DIAGNOSIS — Z0000 Encounter for general adult medical examination without abnormal findings: Secondary | ICD-10-CM | POA: Diagnosis not present

## 2018-11-02 DIAGNOSIS — R7309 Other abnormal glucose: Secondary | ICD-10-CM | POA: Diagnosis not present

## 2018-11-02 DIAGNOSIS — R946 Abnormal results of thyroid function studies: Secondary | ICD-10-CM | POA: Diagnosis not present

## 2018-11-09 DIAGNOSIS — G4700 Insomnia, unspecified: Secondary | ICD-10-CM | POA: Diagnosis not present

## 2018-11-09 DIAGNOSIS — L03019 Cellulitis of unspecified finger: Secondary | ICD-10-CM | POA: Diagnosis not present

## 2018-11-09 DIAGNOSIS — Z0000 Encounter for general adult medical examination without abnormal findings: Secondary | ICD-10-CM | POA: Diagnosis not present

## 2018-11-09 DIAGNOSIS — R946 Abnormal results of thyroid function studies: Secondary | ICD-10-CM | POA: Diagnosis not present

## 2018-11-09 DIAGNOSIS — R7309 Other abnormal glucose: Secondary | ICD-10-CM | POA: Diagnosis not present

## 2018-11-09 DIAGNOSIS — F329 Major depressive disorder, single episode, unspecified: Secondary | ICD-10-CM | POA: Diagnosis not present

## 2018-11-15 MED FILL — MUPIROCIN 2% OINTMENT: 2 | 7 days supply | Qty: 22 | Fill #0

## 2018-11-17 DIAGNOSIS — F4323 Adjustment disorder with mixed anxiety and depressed mood: Secondary | ICD-10-CM | POA: Diagnosis not present

## 2018-11-24 ENCOUNTER — Other Ambulatory Visit: Payer: Self-pay | Admitting: Family

## 2018-11-24 MED ORDER — CEPHALEXIN 500 MG PO CAPS: 500.0000 mg | ORAL_CAPSULE | Freq: Three times a day (TID) | ORAL | 0 refills | Status: AC

## 2018-11-24 MED FILL — CEPHALEXIN 500 MG CAPSULE: 500 | 5 days supply | Qty: 15 | Fill #0

## 2018-12-01 DIAGNOSIS — F4323 Adjustment disorder with mixed anxiety and depressed mood: Secondary | ICD-10-CM | POA: Diagnosis not present

## 2018-12-05 MED FILL — traZODone HCL 50 MG TABS: 50 | 90 days supply | Qty: 180 | Fill #0

## 2018-12-22 DIAGNOSIS — F4323 Adjustment disorder with mixed anxiety and depressed mood: Secondary | ICD-10-CM | POA: Diagnosis not present

## 2019-01-06 ENCOUNTER — Telehealth: Payer: Self-pay | Admitting: Family

## 2019-01-06 DIAGNOSIS — R05 Cough: Secondary | ICD-10-CM

## 2019-01-06 DIAGNOSIS — R059 Cough, unspecified: Secondary | ICD-10-CM

## 2019-01-06 MED ORDER — PROMETHAZINE-DM 6.25-15 MG/5ML PO SYRP: 5.0000 mL | ORAL_SOLUTION | Freq: Four times a day (QID) | ORAL | 0 refills | Status: AC | PRN

## 2019-01-06 MED FILL — PROMETHAZINE W/DM SYRUP: 6.25-15 | 6 days supply | Qty: 118 | Fill #0

## 2019-01-06 NOTE — Progress Notes (Signed)
E-Visit for Corona Virus Screening  Based on your current symptoms, it seems unlikely that your symptoms are related to the Coronavirus.   Coronavirus disease 2019 (COVID-19) is a respiratory illness that can spread from person to person. The virus that causes COVID-19 is a new virus that was first identified in the country of [place] but is now found in multiple other countries and has spread to the [place].  Symptoms associated with the virus are mild to severe fever, cough, and shortness of breath. There is currently no vaccine to protect against COVID-19, and there is no specific antiviral treatment for the virus.   To be considered HIGH RISK for Coronavirus (COVID-19), you have to meet the following criteria:  . Traveled to [place], [place], [place], [place] or [place]; or in the [place] to [place], [place], [place], or [place]; and have fever, cough, and shortness of breath within the last 2 weeks of travel OR  . Been in close contact with a person diagnosed with COVID-19 within the last 2 weeks and have fever, cough, and shortness of breath  . IF YOU DO NOT MEET THESE CRITERIA, YOU ARE CONSIDERED LOW RISK FOR COVID-19.   It is vitally important that if you feel that you have an infection such as this virus or any other virus that you stay home and away from places where you may spread it to others.  You should self-quarantine for 14 days if you have symptoms that could potentially be coronavirus and avoid contact with people age 65 and older.   You can use medication such as A prescription cough medication called Phenergan DM 6.25 mg/15 mg. You make take one teaspoon / 5 ml every 4-6 hours as needed for cough  You may also take acetaminophen (Tylenol) as needed for fever.   Reduce your risk of any infection by using the same precautions used for avoiding the common cold or flu:  . Wash your hands often with soap and warm water for at least 20 seconds.  If soap and water are  not readily available, use an alcohol-based hand sanitizer with at least 60% alcohol.  . If coughing or sneezing, cover your mouth and nose by coughing or sneezing into the elbow areas of your shirt or coat, into a tissue or into your sleeve (not your hands). . Avoid shaking hands with others and consider head nods or verbal greetings only. . Avoid touching your eyes, nose, or mouth with unwashed hands.  . Avoid close contact with people who are sick. . Avoid places or events with large numbers of people in one location, like concerts or sporting events. . Carefully consider travel plans you have or are making. . If you are planning any travel outside or inside the [place], visit the CDC's Travelers' Health webpage for the latest health notices. . If you have some symptoms but not all symptoms, continue to monitor at home and seek medical attention if your symptoms worsen. . If you are having a medical emergency, call 911.  HOME CARE . Only take medications as instructed by your medical team. . Drink plenty of fluids and get plenty of rest. . A steam or ultrasonic humidifier can help if you have congestion.   GET HELP RIGHT AWAY IF: . You develop worsening fever. . You become short of breath . You cough up blood. . Your symptoms become more severe MAKE SURE YOU   Understand these instructions.  Will watch your condition.    Will get help right away if you are not doing well or get worse.  Your e-visit answers were reviewed by a board certified advanced clinical practitioner to complete your personal care plan.  Depending on the condition, your plan could have included both over the counter or prescription medications.  If there is a problem please reply once you have received a response from your provider. Your safety is important to us.  If you have drug allergies check your prescription carefully.    You can use MyChart to ask questions about today's visit, request a non-urgent call back,  or ask for a work or school excuse for 24 hours related to this e-Visit. If it has been greater than 24 hours you will need to follow up with your provider, or enter a new e-Visit to address those concerns. You will get an e-mail in the next two days asking about your experience.  I hope that your e-visit has been valuable and will speed your recovery. Thank you for using e-visits.    

## 2019-01-10 DIAGNOSIS — J309 Allergic rhinitis, unspecified: Secondary | ICD-10-CM | POA: Diagnosis not present

## 2019-01-10 DIAGNOSIS — R079 Chest pain, unspecified: Secondary | ICD-10-CM | POA: Diagnosis not present

## 2019-01-10 DIAGNOSIS — R197 Diarrhea, unspecified: Secondary | ICD-10-CM | POA: Diagnosis not present

## 2019-01-10 DIAGNOSIS — J069 Acute upper respiratory infection, unspecified: Secondary | ICD-10-CM | POA: Diagnosis not present

## 2019-01-10 DIAGNOSIS — R0789 Other chest pain: Secondary | ICD-10-CM | POA: Diagnosis not present

## 2019-01-10 MED FILL — predniSONE 10 MG TABS: 10 | 7 days supply | Qty: 10 | Fill #0

## 2019-01-27 DIAGNOSIS — F4323 Adjustment disorder with mixed anxiety and depressed mood: Secondary | ICD-10-CM | POA: Diagnosis not present

## 2019-02-15 DIAGNOSIS — F4323 Adjustment disorder with mixed anxiety and depressed mood: Secondary | ICD-10-CM | POA: Diagnosis not present

## 2019-03-03 DIAGNOSIS — F4323 Adjustment disorder with mixed anxiety and depressed mood: Secondary | ICD-10-CM | POA: Diagnosis not present

## 2019-03-20 MED FILL — SERTRALINE HCL 100 MG TAB: 100 | 90 days supply | Qty: 90 | Fill #1

## 2019-03-20 MED FILL — buPROPion HCL ER (XL) 150 M: 150 | 90 days supply | Qty: 90 | Fill #1

## 2019-03-23 DIAGNOSIS — F4323 Adjustment disorder with mixed anxiety and depressed mood: Secondary | ICD-10-CM | POA: Diagnosis not present

## 2019-04-07 DIAGNOSIS — Z01419 Encounter for gynecological examination (general) (routine) without abnormal findings: Secondary | ICD-10-CM | POA: Diagnosis not present

## 2019-04-07 DIAGNOSIS — Z6834 Body mass index (BMI) 34.0-34.9, adult: Secondary | ICD-10-CM | POA: Diagnosis not present

## 2019-05-05 MED FILL — traZODone HCL 50 MG TABS: 50 | 90 days supply | Qty: 180 | Fill #1

## 2019-07-21 MED FILL — buPROPion HCL ER (XL) 150 M: 150 | 90 days supply | Qty: 90 | Fill #2

## 2019-07-21 MED FILL — SERTRALINE HCL 100 MG TAB: 100 | 90 days supply | Qty: 90 | Fill #2

## 2020-03-19 ENCOUNTER — Other Ambulatory Visit: Payer: Self-pay

## 2020-03-19 DIAGNOSIS — R928 Other abnormal and inconclusive findings on diagnostic imaging of breast: Secondary | ICD-10-CM

## 2020-03-28 ENCOUNTER — Other Ambulatory Visit: Payer: Self-pay

## 2020-03-28 ENCOUNTER — Ambulatory Visit
Admission: RE | Admit: 2020-03-28 | Discharge: 2020-03-28 | Disposition: A | Discharge status: Home / Self Care | Payer: 59 | Source: Ambulatory Visit

## 2020-03-28 DIAGNOSIS — R928 Other abnormal and inconclusive findings on diagnostic imaging of breast: Secondary | ICD-10-CM

## 2020-04-08 ENCOUNTER — Other Ambulatory Visit: Payer: Self-pay

## 2020-04-08 DIAGNOSIS — R921 Mammographic calcification found on diagnostic imaging of breast: Secondary | ICD-10-CM

## 2020-04-08 DIAGNOSIS — N63 Unspecified lump in unspecified breast: Secondary | ICD-10-CM

## 2020-04-12 ENCOUNTER — Ambulatory Visit
Admission: RE | Admit: 2020-04-12 | Discharge: 2020-04-12 | Disposition: A | Discharge status: Home / Self Care | Payer: 59 | Source: Ambulatory Visit

## 2020-04-12 ENCOUNTER — Other Ambulatory Visit: Payer: Self-pay

## 2020-04-12 DIAGNOSIS — R928 Other abnormal and inconclusive findings on diagnostic imaging of breast: Secondary | ICD-10-CM

## 2020-04-12 DIAGNOSIS — N63 Unspecified lump in unspecified breast: Secondary | ICD-10-CM

## 2020-04-12 HISTORY — PX: BREAST BIOPSY: SHX20

## 2020-09-30 ENCOUNTER — Other Ambulatory Visit: Payer: 59

## 2020-10-18 ENCOUNTER — Other Ambulatory Visit: Payer: 59

## 2020-10-18 DIAGNOSIS — Z20822 Contact with and (suspected) exposure to covid-19: Secondary | ICD-10-CM

## 2020-10-19 LAB — NOVEL CORONAVIRUS, NAA: SARS-CoV-2, NAA: NOT DETECTED

## 2020-10-19 LAB — SARS-COV-2, NAA 2 DAY TAT

## 2020-11-28 IMAGING — MG MM BREAST LOCALIZATION CLIP
4 series · 4 of 12 positions shown · non-contrast
Comparison: Previous exam(s).

CLINICAL DATA: Confirmation of clip placement after
ultrasound-guided core needle biopsy of a mass in the OUTER RIGHT
breast.

EXAM:
2D AND TOMOSYNTHESIS DIAGNOSTIC RIGHT MAMMOGRAM POST ULTRASOUND
BIOPSY

[R ML synth-2D]
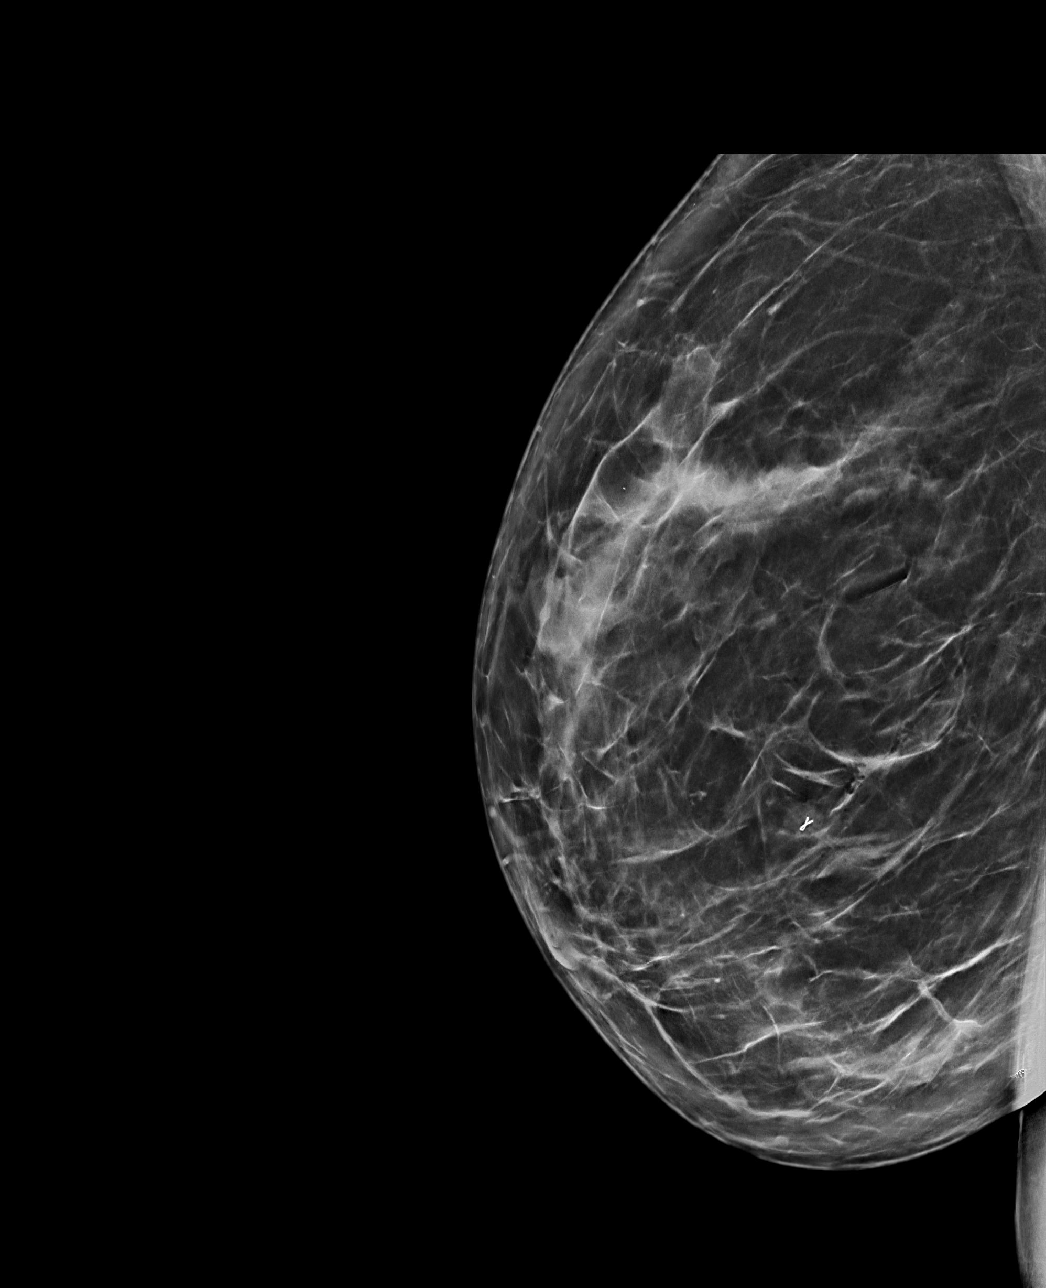

[R CC synth-2D]
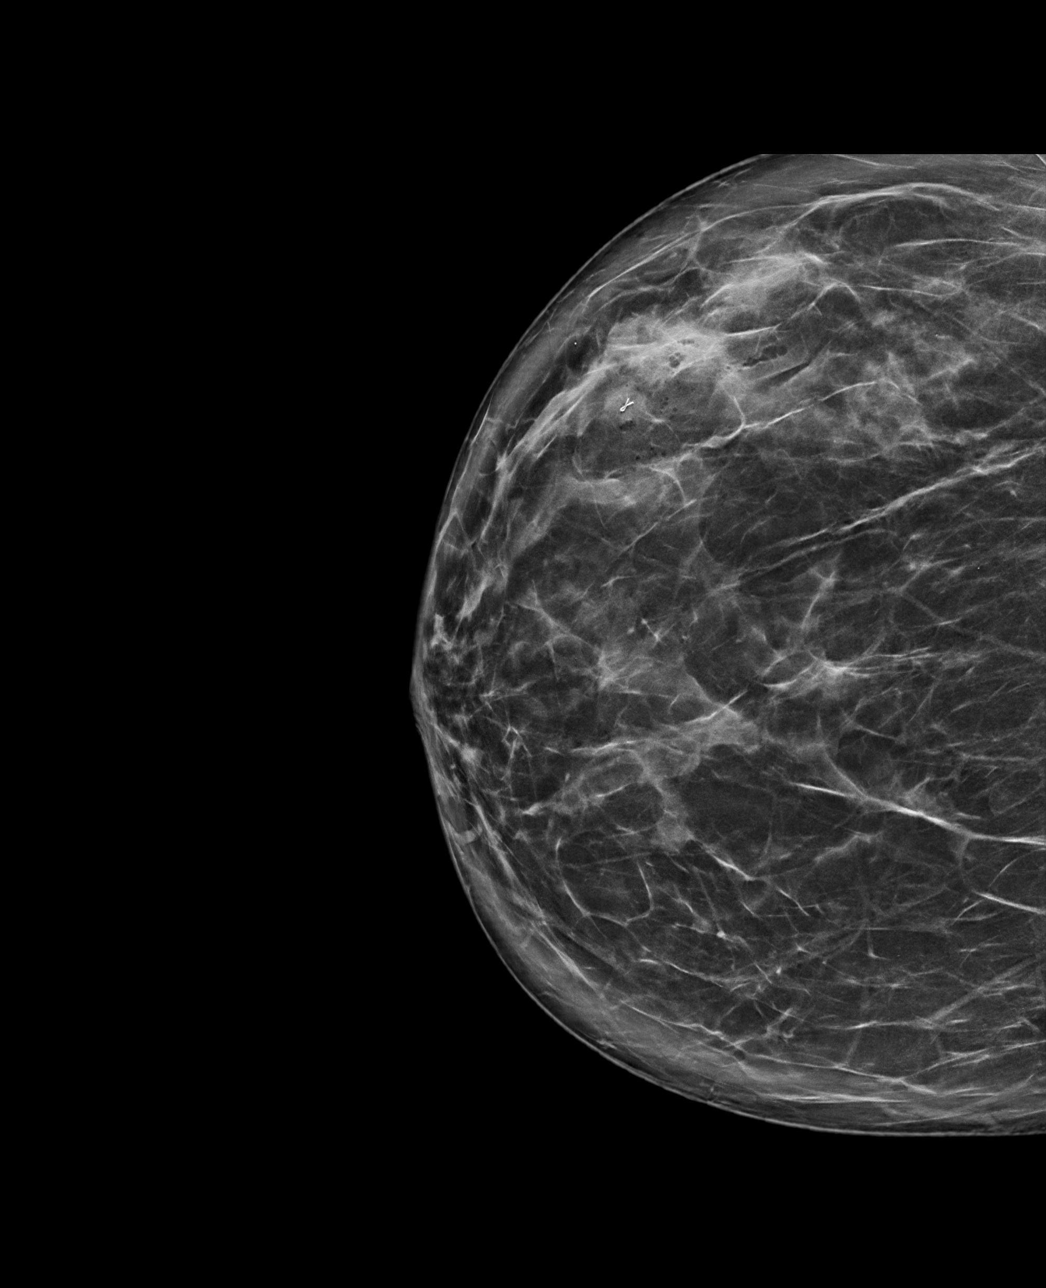

[R ML tomo · tomo slice 49/96.0]
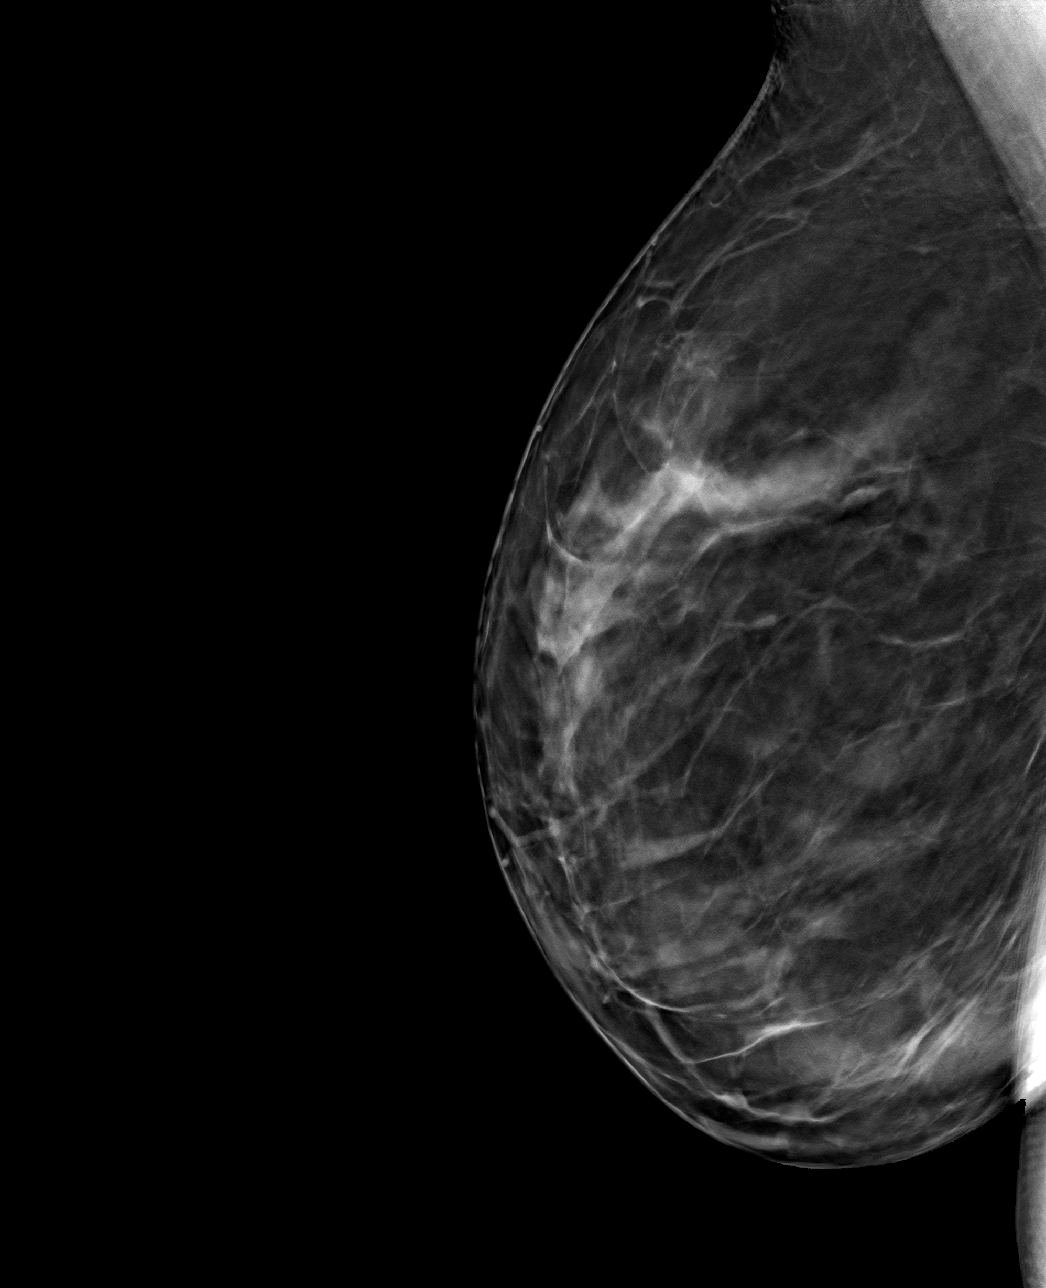

[R CC tomo · tomo slice 48/95.0]
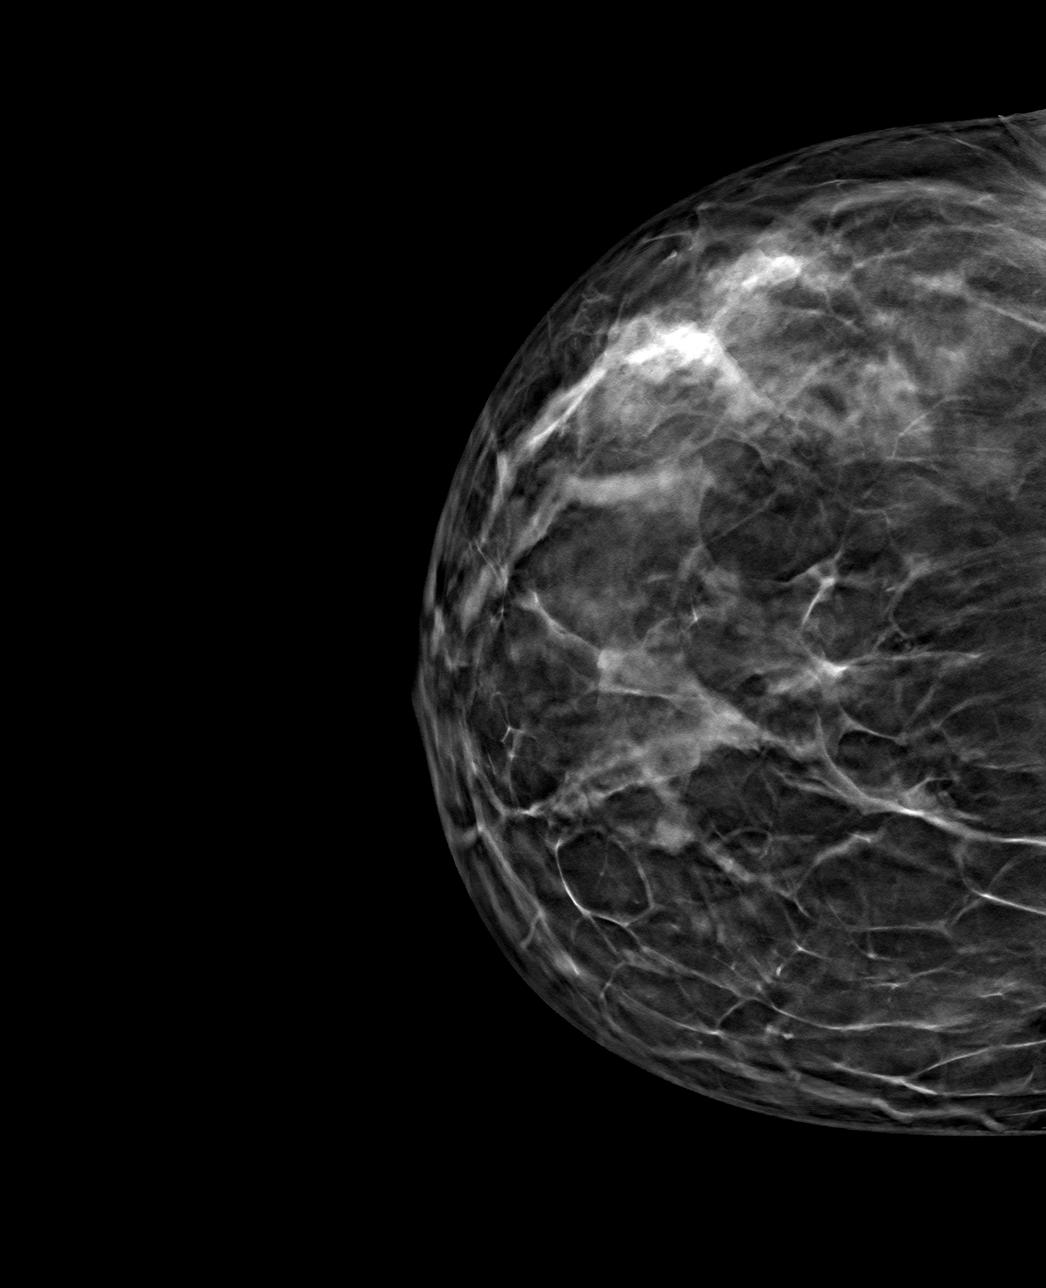

[4 of 12 positions shown; findings below may reference images not displayed]

FINDINGS: Tomosynthesis and synthesized full field CC and mediolateral images
were obtained following ultrasound guided biopsy of a mass in the
OUTER RIGHT breast. The ribbon shaped tissue marking clip is
appropriately position within the biopsied mass.

Expected post biopsy changes are present without evidence of
hematoma.
IMPRESSION: Appropriate positioning of the ribbon shaped tissue marking clip
within biopsied mass in the OUTER RIGHT breast.

Final Assessment: Post Procedure Mammograms for Marker Placement

## 2023-09-02 ENCOUNTER — Other Ambulatory Visit: Payer: Self-pay | Admitting: Family Medicine

## 2023-09-02 DIAGNOSIS — Z1231 Encounter for screening mammogram for malignant neoplasm of breast: Secondary | ICD-10-CM

## 2023-09-30 ENCOUNTER — Ambulatory Visit
Admission: RE | Admit: 2023-09-30 | Discharge: 2023-09-30 | Disposition: A | Discharge status: Home / Self Care | Payer: 59 | Source: Ambulatory Visit | Attending: Family Medicine | Admitting: Family Medicine

## 2023-09-30 DIAGNOSIS — Z1231 Encounter for screening mammogram for malignant neoplasm of breast: Secondary | ICD-10-CM
